# Patient Record
Sex: Female | Born: 1995 | Race: Black or African American | Hispanic: No | Marital: Single | Smoking: Never smoker
Health system: Southern US, Community
[De-identification: ages and names within clinical notes are randomized; demographics above are authoritative.]

## PROBLEM LIST (undated history)

## (undated) ENCOUNTER — Inpatient Hospital Stay (HOSPITAL_COMMUNITY): Payer: Self-pay

## (undated) DIAGNOSIS — O24419 Gestational diabetes mellitus in pregnancy, unspecified control: Secondary | ICD-10-CM

## (undated) DIAGNOSIS — J3089 Other allergic rhinitis: Secondary | ICD-10-CM

## (undated) DIAGNOSIS — J45909 Unspecified asthma, uncomplicated: Secondary | ICD-10-CM

## (undated) HISTORY — PX: NO PAST SURGERIES: SHX2092

---
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
## 2018-01-31 ENCOUNTER — Encounter (HOSPITAL_COMMUNITY): Payer: Self-pay | Admitting: *Deleted

## 2018-01-31 ENCOUNTER — Inpatient Hospital Stay (HOSPITAL_COMMUNITY)
Admission: AD | Admit: 2018-01-31 | Discharge: 2018-01-31 | Disposition: A | Discharge status: Home / Self Care | Payer: Managed Care, Other (non HMO) | Source: Ambulatory Visit | Attending: Obstetrics & Gynecology | Admitting: Obstetrics & Gynecology

## 2018-01-31 DIAGNOSIS — O4690 Antepartum hemorrhage, unspecified, unspecified trimester: Secondary | ICD-10-CM

## 2018-01-31 DIAGNOSIS — N921 Excessive and frequent menstruation with irregular cycle: Secondary | ICD-10-CM

## 2018-01-31 DIAGNOSIS — Z79899 Other long term (current) drug therapy: Secondary | ICD-10-CM | POA: Diagnosis not present

## 2018-01-31 DIAGNOSIS — N939 Abnormal uterine and vaginal bleeding, unspecified: Secondary | ICD-10-CM | POA: Insufficient documentation

## 2018-01-31 DIAGNOSIS — B379 Candidiasis, unspecified: Secondary | ICD-10-CM | POA: Diagnosis not present

## 2018-01-31 DIAGNOSIS — J45909 Unspecified asthma, uncomplicated: Secondary | ICD-10-CM | POA: Insufficient documentation

## 2018-01-31 DIAGNOSIS — R109 Unspecified abdominal pain: Secondary | ICD-10-CM | POA: Insufficient documentation

## 2018-01-31 HISTORY — DX: Unspecified asthma, uncomplicated: J45.909

## 2018-01-31 HISTORY — DX: Other allergic rhinitis: J30.89

## 2018-01-31 LAB — URINALYSIS, ROUTINE W REFLEX MICROSCOPIC
Bacteria, UA: NONE SEEN
Bilirubin Urine: NEGATIVE
GLUCOSE, UA: NEGATIVE mg/dL
Ketones, ur: NEGATIVE mg/dL
Leukocytes, UA: NEGATIVE
NITRITE: NEGATIVE
Protein, ur: NEGATIVE mg/dL
SPECIFIC GRAVITY, URINE: 1.02 (ref 1.005–1.030)
pH: 7 (ref 5.0–8.0)

## 2018-01-31 LAB — HCG, QUANTITATIVE, PREGNANCY: hCG, Beta Chain, Quant, S: <1 m[IU]/mL (ref <5)

## 2018-01-31 LAB — POCT PREGNANCY, URINE: Preg Test, Ur: NEGATIVE

## 2018-01-31 MED ORDER — FLUCONAZOLE 150 MG PO TABS: 150.0000 mg | ORAL_TABLET | Freq: Every day | ORAL | 1 refills | Status: DC

## 2018-01-31 NOTE — MAU Note (Signed)
Pt C/O bleeding that started today, lower abd cramping for the last 2-3 weeks.  Pos UPT @ MD office two days ago.

## 2018-01-31 NOTE — Progress Notes (Signed)
Patient signed printed copy of AVS.  Discharge instructions and Rx reviewed.  Pt verbalized understanding.  No questions at this time.  Dc'd home in good condition.

## 2018-01-31 NOTE — MAU Provider Note (Signed)
Chief Complaint: Abdominal Pain and Vaginal Bleeding   SUBJECTIVE HPI: Alexandra Ayala is a 22 y.o. No obstetric history on file. at Unknown who presents to MAU vaginal bleeding that started x 1 day ago, pt endorses has had small dime sized clotts, no fould order, mild cramping, has nto taken any medication for it, pt was seen in [facility] 2 days ago and was informed her UPT was positive, she endorses she was on BCP and at time has missed one pill but took 2 the next day. Pt stated she was having mild cramps that began as soon as her LMP (aug 5 ) ended and was unsure of what it was, that prompted [facility] to perform a vaginal probe and pt was told her had yeast infecting, pt was given cream but never picked it up, pt denies n, v, d, rashes, fever, cp or sob, no increase in vaginal discharge at this time. Pt denies dysuria and denies any vaginal irritation or itching. [facility] did a hcg quant which was neg. Pt was not informed. Pt is in an actively sexual relationship with a female partner and does not wear condoms. Pt also had g/c testing at the office which was negative.   Past Medical History:  Diagnosis Date  . Asthma    has inhaler  . Environmental and seasonal allergies    OB History  Gravida Para Term Preterm AB Living  0 0 0 0 0 0  SAB TAB Ectopic Multiple Live Births  0 0 0 0 0   History reviewed. No pertinent surgical history. Social History   Socioeconomic History  . Marital status: Single    Spouse name: Not on file  . Number of children: Not on file  . Years of education: Not on file  . Highest education level: Not on file  Occupational History  . Not on file  Social Needs  . Financial resource strain: Not on file  . Food insecurity:    Worry: Not on file    Inability: Not on file  . Transportation needs:    Medical: Not on file    Non-medical: Not on file  Tobacco Use  . Smoking status: Never Smoker  . Smokeless tobacco: Never Used  Substance and Sexual Activity  . Alcohol use:  Yes    Comment: occasional, last on 8-18  . Drug use: Yes    Types: Marijuana    Comment: last used 01-28-18  . Sexual activity: Not on file  Lifestyle  . Physical activity:    Days per week: Not on file    Minutes per session: Not on file  . Stress: Not on file  Relationships  . Social connections:    Talks on phone: Not on file    Gets together: Not on file    Attends religious service: Not on file    Active member of club or organization: Not on file    Attends meetings of clubs or organizations: Not on file    Relationship status: Not on file  . Intimate partner violence:    Fear of current or ex partner: Not on file    Emotionally abused: Not on file    Physically abused: Not on file    Forced sexual activity: Not on file  Other Topics Concern  . Not on file  Social History Narrative  . Not on file   No current facility-administered medications on file prior to encounter.    Current Outpatient Medications on File Prior to  Encounter  Medication Sig Dispense Refill  . albuterol (PROVENTIL HFA;VENTOLIN HFA) 108 (90 Base) MCG/ACT inhaler Inhale 2 puffs into the lungs every 6 (six) hours as needed for wheezing or shortness of breath.    . cetirizine (ZYRTEC) 10 MG tablet Take 10 mg by mouth daily.     No Known Allergies  I have reviewed the past Medical Hx, Surgical Hx, Social Hx, Allergies and Medications.   REVIEW OF SYSTEMS All systems reviewed and are negative for acute change except as noted in the HPI.   OBJECTIVE BP 134/87 (BP Location: Right Arm)   Pulse 76   Temp 98.3 F (36.8 C) (Oral)   Resp 16   Ht 5\' 4"  (1.626 m)   Wt 70.3 kg   LMP 01/13/2018   BMI 26.61 kg/m    PHYSICAL EXAM Constitutional: Well-developed, well-nourished female in no acute distress.  Cardiovascular: normal rate and rhythm, pulses intact Respiratory: normal rate and effort.  GI: Abd soft, non-tender, non-distended. Pos BS x 4 MS: Extremities nontender, no edema, normal  ROM Neurologic: Alert and oriented x 4. No focal deficits GU: Neg CVAT. SPECULUM EXAM: NEFG, physiologic discharge, blood noted from cervical opening  BIMANUAL: cervix closed thick and high no CMT; uterus normal size, no adnexal tenderness or masses. No CMT. Psych: normal mood and affect  LAB RESULTS Results for orders placed or performed during the hospital encounter of 01/31/18 (from the past 24 hour(s))  Urinalysis, Routine w reflex microscopic     Status: Abnormal   Collection Time: 01/31/18 12:38 PM  Result Value Ref Range   Color, Urine YELLOW YELLOW   APPearance CLEAR CLEAR   Specific Gravity, Urine 1.020 1.005 - 1.030   pH 7.0 5.0 - 8.0   Glucose, UA NEGATIVE NEGATIVE mg/dL   Hgb urine dipstick LARGE (A) NEGATIVE   Bilirubin Urine NEGATIVE NEGATIVE   Ketones, ur NEGATIVE NEGATIVE mg/dL   Protein, ur NEGATIVE NEGATIVE mg/dL   Nitrite NEGATIVE NEGATIVE   Leukocytes, UA NEGATIVE NEGATIVE   RBC / HPF 0-5 0 - 5 RBC/hpf   WBC, UA 0-5 0 - 5 WBC/hpf   Bacteria, UA NONE SEEN NONE SEEN   Squamous Epithelial / LPF 0-5 0 - 5   Mucus PRESENT   Pregnancy, urine POC     Status: None   Collection Time: 01/31/18 12:40 PM  Result Value Ref Range   Preg Test, Ur NEGATIVE NEGATIVE  hCG, quantitative, pregnancy     Status: None   Collection Time: 01/31/18  1:50 PM  Result Value Ref Range   hCG, Beta Chain, Quant, S <1 <5 mIU/mL    IMAGING No results found.  MAU Management/MDM: Vitals and nursing notes reviewed Orders Placed This Encounter  Procedures  . [place] MFM OB Comp Less 14 Wks  . Urinalysis, Routine w reflex microscopic  . hCG, quantitative, pregnancy  . Diet - low sodium heart healthy  . Increase activity slowly  . Call MD for:  . Call MD for:  temperature >100.4  . Call MD for:  persistant nausea and vomiting  . Call MD for:  severe uncontrolled pain  . Call MD for:  redness, tenderness, or signs of infection (pain, swelling, redness, odor or green/yellow discharge  around incision site)  . Call MD for:  difficulty breathing, headache or visual disturbances  . Call MD for:  hives  . Call MD for:  persistant dizziness or light-headedness  . Call MD for:  extreme fatigue  . (HEART FAILURE  PATIENTS) Call MD:  Anytime you have any of the following symptoms: 1) 3 pound weight gain in 24 hours or 5 pounds in 1 week 2) shortness of breath, with or without a dry hacking cough 3) swelling in the hands, feet or stomach 4) if you have to sleep on extra pillows at night in order to breathe.  . Pregnancy, urine POC  . Discharge patient Discharge disposition: 01-Home or Self Care; Discharge patient date: 01/31/2018    Meds ordered this encounter  Medications  . fluconazole (DIFLUCAN) 150 MG tablet    Sig: Take 1 tablet (150 mg total) by mouth daily.    Dispense:  1 tablet    Refill:  1    Order Specific Question:   Supervising Provider    Answer:   Osborn CohoOBERTS, ANGELA [[id number]]    Plan of care reviewed with patient, including labs and tests ordered and medical treatment.   ASSESSMENT 1. Breakthrough bleeding   2. Vaginal bleeding in pregnancy   3. Candidiasis   Pt not preg, neg UPT and beta quant <1. Vaginal exam unremarkable, blood present in cervical OS, my impression is this bleeding is due to stopping her BCP 2 days ago. Pt denies vaginal itching but since she was dx with a yeast infection from the office pt did not pick up her cream and wanted by mouth pills, therefore script given.  PLAN Discharge home in stable condition. Pt discharged with strict bleeding precautions. Counseled on return precautions Start taking BCP again with a Sunday start date and a new pack use back up condoms for 1 week.  Yeast: Take one pill by mouth now and may take another pill in three days if symptoms do not resolve.  Handout given  Follow-up Information    [facility] up in 2 week(s).   Specialty:  Obstetrics and Gynecology Contact  information: [address] [place] [phone]          Allergies as of 01/31/2018   No Known Allergies     Medication List    TAKE these medications   albuterol 108 (90 Base) MCG/ACT inhaler Commonly known as:  PROVENTIL HFA;VENTOLIN HFA Inhale 2 puffs into the lungs every 6 (six) hours as needed for wheezing or shortness of breath.   cetirizine 10 MG tablet Commonly known as:  ZYRTEC Take 10 mg by mouth daily.   fluconazole 150 MG tablet Commonly known as:  DIFLUCAN Take 1 tablet (150 mg total) by mouth daily.      Jaleena Viviani, NP-C, CNM 01/31/2018, 4:19 PM

## 2018-02-27 ENCOUNTER — Encounter: Payer: Self-pay | Admitting: Family Medicine

## 2018-02-27 ENCOUNTER — Ambulatory Visit: Payer: Managed Care, Other (non HMO) | Admitting: Family Medicine

## 2018-02-27 ENCOUNTER — Ambulatory Visit (INDEPENDENT_AMBULATORY_CARE_PROVIDER_SITE_OTHER): Payer: Managed Care, Other (non HMO) | Admitting: Family Medicine

## 2018-02-27 VITALS — BP 126/82 | HR 84 | Temp 98.5°F | Ht 64.0 in | Wt 155.0 lb

## 2018-02-27 DIAGNOSIS — Z0000 Encounter for general adult medical examination without abnormal findings: Secondary | ICD-10-CM | POA: Diagnosis not present

## 2018-02-27 DIAGNOSIS — J302 Other seasonal allergic rhinitis: Secondary | ICD-10-CM | POA: Insufficient documentation

## 2018-02-27 DIAGNOSIS — R635 Abnormal weight gain: Secondary | ICD-10-CM | POA: Diagnosis not present

## 2018-02-27 DIAGNOSIS — Z131 Encounter for screening for diabetes mellitus: Secondary | ICD-10-CM

## 2018-02-27 LAB — CBC WITH DIFFERENTIAL/PLATELET
BASOS ABS: 0 10*3/uL (ref 0.0–0.1)
Basophils Relative: 0.5 % (ref 0.0–3.0)
EOS PCT: 2.9 % (ref 0.0–5.0)
Eosinophils Absolute: 0.1 10*3/uL (ref 0.0–0.7)
HEMATOCRIT: 41.5 % (ref 36.0–46.0)
Hemoglobin: 13.9 g/dL (ref 12.0–15.0)
Lymphocytes Relative: 53.3 % — ABNORMAL HIGH (ref 12.0–46.0)
Lymphs Abs: 2.6 10*3/uL (ref 0.7–4.0)
MCHC: 33.4 g/dL (ref 30.0–36.0)
MCV: 90.7 fl (ref 78.0–100.0)
MONOS PCT: 8.1 % (ref 3.0–12.0)
Monocytes Absolute: 0.4 10*3/uL (ref 0.1–1.0)
Neutro Abs: 1.7 10*3/uL (ref 1.4–7.7)
Neutrophils Relative %: 35.2 % — ABNORMAL LOW (ref 43.0–77.0)
PLATELETS: 296 10*3/uL (ref 150.0–400.0)
RBC: 4.58 Mil/uL (ref 3.87–5.11)
RDW: 12.7 % (ref 11.5–15.5)
WBC: 4.8 10*3/uL (ref 4.0–10.5)

## 2018-02-27 LAB — TSH: TSH: 0.4 u[IU]/mL (ref 0.35–4.50)

## 2018-02-27 LAB — BASIC METABOLIC PANEL
BUN: 11 mg/dL (ref 6–23)
CHLORIDE: 103 meq/L (ref 96–112)
CO2: 27 mEq/L (ref 19–32)
Calcium: 9.8 mg/dL (ref 8.4–10.5)
Creatinine, Ser: 0.73 mg/dL (ref 0.40–1.20)
GFR: 128.9 mL/min (ref >60.00)
Glucose, Bld: 80 mg/dL (ref 70–99)
POTASSIUM: 4.2 meq/L (ref 3.5–5.1)
Sodium: 139 mEq/L (ref 135–145)

## 2018-02-27 LAB — T4, FREE: Free T4: 0.93 ng/dL (ref 0.60–1.60)

## 2018-02-27 LAB — HEMOGLOBIN A1C: HEMOGLOBIN A1C: 4.9 % (ref 4.6–6.5)

## 2018-02-27 NOTE — Progress Notes (Signed)
Subjective:     Alexandra Ayala is a 22 y.o. female and is here for a comprehensive physical exam.  The patient reports problems - weight gain.  Pt notes weight gain over the last few months without trying.  Pt states she is active at her job at [employer].  Pt also notes increased thirst over the last month.  Pt may drink 2-3 bottles of water per day.    Pt was recently seen by OB/Gyn. Pt was told she was pregnant, but later told it was a false positive test.  Seasonal allergies:  -will take zyrtec  Allergies: NKDA  Pt working at [employer].  Pt denies EtOH, tobacco, and drug use.  Social History   Socioeconomic History  . Marital status: Single    Spouse name: Not on file  . Number of children: Not on file  . Years of education: Not on file  . Highest education level: Not on file  Occupational History  . Not on file  Social Needs  . Financial resource strain: Not on file  . Food insecurity:    Worry: Not on file    Inability: Not on file  . Transportation needs:    Medical: Not on file    Non-medical: Not on file  Tobacco Use  . Smoking status: Never Smoker  . Smokeless tobacco: Never Used  Substance and Sexual Activity  . Alcohol use: Yes    Comment: occasional, last on 8-18  . Drug use: Yes    Types: Marijuana    Comment: last used 01-28-18  . Sexual activity: Not on file  Lifestyle  . Physical activity:    Days per week: Not on file    Minutes per session: Not on file  . Stress: Not on file  Relationships  . Social connections:    Talks on phone: Not on file    Gets together: Not on file    Attends religious service: Not on file    Active member of club or organization: Not on file    Attends meetings of clubs or organizations: Not on file    Relationship status: Not on file  . Intimate partner violence:    Fear of current or ex partner: Not on file    Emotionally abused: Not on file    Physically abused: Not on file    Forced sexual activity:  Not on file  Other Topics Concern  . Not on file  Social History Narrative  . Not on file   Health Maintenance  Topic Date Due  . HIV Screening  09/26/2011  . TETANUS/TDAP  09/26/2015  . PAP SMEAR  09/25/2017  . INFLUENZA VACCINE  01/09/2018    The following portions of the patient's history were reviewed and updated as appropriate: allergies, current medications, past family history, past medical history, past social history, past surgical history and problem list.  Review of Systems A comprehensive review of systems was negative.   Objective:    BP 126/82 (BP Location: Right Arm, Patient Position: Sitting, Cuff Size: Normal)   Pulse 84   Temp 98.5 F (36.9 C) (Oral)   Ht 5\' 4"  (1.626 m)   Wt 155 lb (70.3 kg)   LMP 02/26/2018 (Exact Date)   SpO2 98%   BMI 26.61 kg/m  General appearance: alert, cooperative, appears stated age and no distress Head: Normocephalic, without obvious abnormality, atraumatic Eyes: conjunctivae/corneas clear. PERRL, EOM's intact. Fundi benign. Ears: normal TM's and external ear canals  both ears Nose: Nares normal. Septum midline. Mucosa normal. No drainage or sinus tenderness. Throat: lips, mucosa, and tongue normal; teeth and gums normal Neck: no adenopathy, no JVD, supple, symmetrical, trachea midline and thyroid not enlarged, symmetric, no tenderness/mass/nodules Lungs: clear to auscultation bilaterally Heart: regular rate and rhythm, S1, S2 normal, no murmur, click, rub or gallop Abdomen: soft, non-tender; bowel sounds normal; no masses,  no organomegaly Extremities: extremities normal, atraumatic, no cyanosis or edema Skin: Skin color, texture, turgor normal. No rashes or lesions Neurologic: Alert and oriented X 3, normal strength and tone. Normal symmetric reflexes. Normal coordination and gait    Assessment:    Healthy female exam.      Plan:     Anticipatory guidance given including wearing seatbelts, smoke detectors in the home,  increasing physical activity, increasing p.o. intake of water and vegetables. -will obtain labs  -pap up to date, followed by OB/Gyn -given handout -next CPE in 1 yr See After Visit Summary for Counseling Recommendations   Weight gain -will obtain TSH, free T4  Seasonal allergies -continue Zyrtec prn  F/u prn  Abbe Amsterdam, MD

## 2018-02-27 NOTE — Patient Instructions (Addendum)
Preventive Care 18-39 Years, Female Preventive care refers to lifestyle choices and visits with your health care provider that can promote health and wellness. What does preventive care include?  A yearly physical exam. This is also called an annual well check.  Dental exams once or twice a year.  Routine eye exams. Ask your health care provider how often you should have your eyes checked.  Personal lifestyle choices, including: ? Daily care of your teeth and gums. ? Regular physical activity. ? Eating a healthy diet. ? Avoiding tobacco and drug use. ? Limiting alcohol use. ? Practicing safe sex. ? Taking vitamin and mineral supplements as recommended by your health care provider. What happens during an annual well check? The services and screenings done by your health care provider during your annual well check will depend on your age, overall health, lifestyle risk factors, and family history of disease. Counseling Your health care provider may ask you questions about your:  Alcohol use.  Tobacco use.  Drug use.  Emotional well-being.  Home and relationship well-being.  Sexual activity.  Eating habits.  Work and work Statistician.  Method of birth control.  Menstrual cycle.  Pregnancy history.  Screening You may have the following tests or measurements:  Height, weight, and BMI.  Diabetes screening. This is done by checking your blood sugar (glucose) after you have not eaten for a while (fasting).  Blood pressure.  Lipid and cholesterol levels. These may be checked every 5 years starting at age 66.  Skin check.  Hepatitis C blood test.  Hepatitis B blood test.  Sexually transmitted disease (STD) testing.  BRCA-related cancer screening. This may be done if you have a family history of breast, ovarian, tubal, or peritoneal cancers.  Pelvic exam and Pap test. This may be done every 3 years starting at age 40. Starting at age 59, this may be done every 5  years if you have a Pap test in combination with an HPV test.  Discuss your test results, treatment options, and if necessary, the need for more tests with your health care provider. Vaccines Your health care provider may recommend certain vaccines, such as:  Influenza vaccine. This is recommended every year.  Tetanus, diphtheria, and acellular pertussis (Tdap, Td) vaccine. You may need a Td booster every 10 years.  Varicella vaccine. You may need this if you have not been vaccinated.  HPV vaccine. If you are 69 or younger, you may need three doses over 6 months.  Measles, mumps, and rubella (MMR) vaccine. You may need at least one dose of MMR. You may also need a second dose.  Pneumococcal 13-valent conjugate (PCV13) vaccine. You may need this if you have certain conditions and were not previously vaccinated.  Pneumococcal polysaccharide (PPSV23) vaccine. You may need one or two doses if you smoke cigarettes or if you have certain conditions.  Meningococcal vaccine. One dose is recommended if you are age 27-21 years and a first-year college student living in a residence hall, or if you have one of several medical conditions. You may also need additional booster doses.  Hepatitis A vaccine. You may need this if you have certain conditions or if you travel or work in places where you may be exposed to hepatitis A.  Hepatitis B vaccine. You may need this if you have certain conditions or if you travel or work in places where you may be exposed to hepatitis B.  Haemophilus influenzae type b (Hib) vaccine. You may need this if  you have certain risk factors.  Talk to your health care provider about which screenings and vaccines you need and how often you need them. This information is not intended to replace advice given to you by your health care provider. Make sure you discuss any questions you have with your health care provider. Document Released: 07/24/2001 Document Revised: 02/15/2016  Document Reviewed: 03/29/2015 Elsevier Interactive Patient Education  2018 [place] 18-39 Years, Female Preventive care refers to lifestyle choices and visits with your health care provider that can promote health and wellness. What does preventive care include?  A yearly physical exam. This is also called an annual well check.  Dental exams once or twice a year.  Routine eye exams. Ask your health care provider how often you should have your eyes checked.  Personal lifestyle choices, including: ? Daily care of your teeth and gums. ? Regular physical activity. ? Eating a healthy diet. ? Avoiding tobacco and drug use. ? Limiting alcohol use. ? Practicing safe sex. ? Taking vitamin and mineral supplements as recommended by your health care provider. What happens during an annual well check? The services and screenings done by your health care provider during your annual well check will depend on your age, overall health, lifestyle risk factors, and family history of disease. Counseling Your health care provider may ask you questions about your:  Alcohol use.  Tobacco use.  Drug use.  Emotional well-being.  Home and relationship well-being.  Sexual activity.  Eating habits.  Work and work Statistician.  Method of birth control.  Menstrual cycle.  Pregnancy history.  Screening You may have the following tests or measurements:  Height, weight, and BMI.  Diabetes screening. This is done by checking your blood sugar (glucose) after you have not eaten for a while (fasting).  Blood pressure.  Lipid and cholesterol levels. These may be checked every 5 years starting at age 76.  Skin check.  Hepatitis C blood test.  Hepatitis B blood test.  Sexually transmitted disease (STD) testing.  BRCA-related cancer screening. This may be done if you have a family history of breast, ovarian, tubal, or peritoneal cancers.  Pelvic exam and Pap test.  This may be done every 3 years starting at age 46. Starting at age 55, this may be done every 5 years if you have a Pap test in combination with an HPV test.  Discuss your test results, treatment options, and if necessary, the need for more tests with your health care provider. Vaccines Your health care provider may recommend certain vaccines, such as:  Influenza vaccine. This is recommended every year.  Tetanus, diphtheria, and acellular pertussis (Tdap, Td) vaccine. You may need a Td booster every 10 years.  Varicella vaccine. You may need this if you have not been vaccinated.  HPV vaccine. If you are 2 or younger, you may need three doses over 6 months.  Measles, mumps, and rubella (MMR) vaccine. You may need at least one dose of MMR. You may also need a second dose.  Pneumococcal 13-valent conjugate (PCV13) vaccine. You may need this if you have certain conditions and were not previously vaccinated.  Pneumococcal polysaccharide (PPSV23) vaccine. You may need one or two doses if you smoke cigarettes or if you have certain conditions.  Meningococcal vaccine. One dose is recommended if you are age 53-21 years and a first-year college student living in a residence hall, or if you have one of several medical conditions. You may also need  additional booster doses.  Hepatitis A vaccine. You may need this if you have certain conditions or if you travel or work in places where you may be exposed to hepatitis A.  Hepatitis B vaccine. You may need this if you have certain conditions or if you travel or work in places where you may be exposed to hepatitis B.  Haemophilus influenzae type b (Hib) vaccine. You may need this if you have certain risk factors.  Talk to your health care provider about which screenings and vaccines you need and how often you need them. This information is not intended to replace advice given to you by your health care provider. Make sure you discuss any questions you  have with your health care provider. Document Released: 07/24/2001 Document Revised: 02/15/2016 Document Reviewed: 03/29/2015 Elsevier Interactive Patient Education  2018 [employer] Inc[employer]  

## 2019-05-05 ENCOUNTER — Other Ambulatory Visit: Payer: Self-pay

## 2019-05-05 DIAGNOSIS — K624 Stenosis of anus and rectum: Secondary | ICD-10-CM

## 2019-05-07 LAB — NOVEL CORONAVIRUS, NAA: SARS-CoV-2, NAA: NOT DETECTED

## 2021-12-09 ENCOUNTER — Encounter (HOSPITAL_COMMUNITY): Payer: Self-pay | Admitting: *Deleted

## 2021-12-09 ENCOUNTER — Other Ambulatory Visit: Payer: Self-pay

## 2021-12-09 ENCOUNTER — Emergency Department (HOSPITAL_COMMUNITY)
Admission: EM | Admit: 2021-12-09 | Discharge: 2021-12-09 | Disposition: A | Discharge status: Home / Self Care | Payer: Managed Care, Other (non HMO) | Attending: Emergency Medicine | Admitting: Emergency Medicine

## 2021-12-09 DIAGNOSIS — M25551 Pain in right hip: Secondary | ICD-10-CM | POA: Diagnosis not present

## 2021-12-09 DIAGNOSIS — M25552 Pain in left hip: Secondary | ICD-10-CM | POA: Insufficient documentation

## 2021-12-09 DIAGNOSIS — R1031 Right lower quadrant pain: Secondary | ICD-10-CM

## 2021-12-09 DIAGNOSIS — R1030 Lower abdominal pain, unspecified: Secondary | ICD-10-CM | POA: Diagnosis not present

## 2021-12-09 DIAGNOSIS — R102 Pelvic and perineal pain: Secondary | ICD-10-CM | POA: Diagnosis present

## 2021-12-09 LAB — URINALYSIS, ROUTINE W REFLEX MICROSCOPIC
Bacteria, UA: NONE SEEN
Bilirubin Urine: NEGATIVE
Glucose, UA: NEGATIVE mg/dL
Ketones, ur: NEGATIVE mg/dL
Leukocytes,Ua: NEGATIVE
Nitrite: NEGATIVE
Protein, ur: NEGATIVE mg/dL
Specific Gravity, Urine: 1.026 (ref 1.005–1.030)
pH: 5 (ref 5.0–8.0)

## 2021-12-09 LAB — I-STAT BETA HCG BLOOD, ED (MC, WL, AP ONLY): I-stat hCG, quantitative: <5 m[IU]/mL (ref <5)

## 2021-12-09 MED ORDER — IBUPROFEN 200 MG PO TABS
600.0000 mg | ORAL_TABLET | Freq: Once | ORAL | Status: AC
  Administered 2021-12-09: 600 mg via ORAL
  Filled 2021-12-09: qty 3

## 2021-12-09 NOTE — Discharge Instructions (Addendum)
Please follow-up with your OB/GYN and your primary care provider.  You may always return to the emergency room for any new or concerning symptoms.  I do recommend taking Tylenol and ibuprofen as discussed below  Please use Tylenol or ibuprofen for pain.  You may use 600 mg ibuprofen every 6 hours or 1000 mg of Tylenol every 6 hours.  You may choose to alternate between the 2.  This would be most effective.  Not to exceed 4 g of Tylenol within 24 hours.  Not to exceed 3200 mg ibuprofen 24 hours.

## 2021-12-09 NOTE — ED Notes (Signed)
Pt d/c home per MD order. Discharge summary reviewed with pt, pt verbalizes understanding. Ambulatory off unit. No s/s of acute distress noted at discharge.  °

## 2021-12-09 NOTE — ED Provider Notes (Signed)
[facility]-EMERGENCY DEPT Provider Note   CSN: [id number] Arrival date & time: 12/09/21  1220     History  Chief Complaint  Patient presents with   Pelvic Pain    Alexandra Ayala is a 26 y.o. female.   Pelvic Pain   26 yo female with history of restless leg syndrome presents with bilateral hip pain x1 day. She states the pain started last night and describes it as an aching pain that is worse when she walks. She is not currently in pain. She stopped taking oral birth control one month ago. Her LMP was last week. She denies vaginal discharge, bleeding, pruritis, and pain. Denies urinary symptoms, fever, chills.       Home Medications Prior to Admission medications   Medication Sig Start Date End Date Taking? Authorizing Provider  albuterol (PROVENTIL HFA;VENTOLIN HFA) 108 (90 Base) MCG/ACT inhaler Inhale 2 puffs into the lungs every 6 (six) hours as needed for wheezing or shortness of breath.    [provider]  cetirizine (ZYRTEC) 10 MG tablet Take 10 mg by mouth daily.    [provider]      Allergies    Patient has no known allergies.    Review of Systems   Review of Systems  Genitourinary:  Positive for pelvic pain.    Physical Exam Updated Vital Signs BP 136/75   Pulse 86   Temp 98.4 F (36.9 C) (Oral)   Resp 16   Ht 5\' 4"  (1.626 m)   Wt 78.5 kg   SpO2 96%   BMI 29.70 kg/m  Physical Exam Vitals and nursing note reviewed.  Constitutional:      General: She is not in acute distress. HENT:     Head: Normocephalic and atraumatic.     Nose: Nose normal.  Eyes:     General: No scleral icterus. Cardiovascular:     Rate and Rhythm: Normal rate and regular rhythm.     Pulses: Normal pulses.     Heart sounds: Normal heart sounds.  Pulmonary:     Effort: Pulmonary effort is normal. No respiratory distress.     Breath sounds: No wheezing.  Abdominal:     Palpations: Abdomen is soft.     Tenderness: There is no  abdominal tenderness.  Genitourinary:    Comments: Pelvic exam deferred Musculoskeletal:     Cervical back: Normal range of motion.     Right lower leg: No edema.     Left lower leg: No edema.  Skin:    General: Skin is warm and dry.     Capillary Refill: Capillary refill takes less than 2 seconds.  Neurological:     Mental Status: She is alert. Mental status is at baseline.  Psychiatric:        Mood and Affect: Mood normal.        Behavior: Behavior normal.     ED Results / Procedures / Treatments   Labs (all labs ordered are listed, but only abnormal results are displayed) Labs Reviewed  URINALYSIS, ROUTINE W REFLEX MICROSCOPIC - Abnormal; Notable for the following components:      Result Value   Hgb urine dipstick MODERATE (*)    All other components within normal limits  I-STAT BETA HCG BLOOD, ED ([facility], [facility], [facility] ONLY)    EKG None  Radiology No results found.  Procedures Procedures    Medications Ordered in ED Medications  ibuprofen (ADVIL) tablet 600 mg (600 mg Oral Given 12/09/21  1810)    ED Course/ Medical Decision Making/ A&P                           Medical Decision Making Amount and/or Complexity of Data Reviewed Labs: ordered.  Risk OTC drugs.   26 yo female with history of restless leg syndrome presents with bilateral hip pain x1 day. She states the pain started last night and describes it as an aching pain that is worse when she walks. She is not currently in pain. She stopped taking oral birth control one month ago. Her LMP was last week. She denies vaginal discharge, bleeding, pruritis, and pain. Denies urinary symptoms, fever, chills.    Physical exam without significant abnormalities.  Urinalysis moderate hemoglobin patient states that she just recently finished menses.  I-STAT negative for pregnancy  I offered patient additional work-up including pelvic exam, basic labs.  She declined this.  She will follow-up with her OB/GYN.  Provided with  a dose of ibuprofen here.  Return precautions discussed  Final Clinical Impression(s) / ED Diagnoses Final diagnoses:  Inguinal pain of both sides    Rx / DC Orders ED Discharge Orders     None         Gailen Shelter, [place] 12/09/21 1939    Mancel Bale, MD 12/09/21 [phone]

## 2021-12-09 NOTE — ED Triage Notes (Signed)
States she had pelvic pain last night, denies discharge but has noticed urinary frequency last 2 days.

## 2023-06-12 NOTE — L&D Delivery Note (Signed)
 Delivery Note At 1:51 AM a viable and healthy female was delivered via Vaginal, Spontaneous (Presentation: Left Occiput Anterior).  APGAR: 8, 9; weight pending .   Placenta status: Spontaneous, Intact.  Cord: 3 vessels with loose nuchal cord x 1 reduced on the perineum.  The patient pushed for approximately 72 minutes and delivered a vigorous female infant in the left occiput anterior presentation with Apgar scores of 8 at 1 minute and 9 at 5 minutes.  With crowning of the infant's head the anterior shoulder delivered without difficulty.  Loose nuchal cord was noted and reduced at this time.  The posterior shoulder and remainder of the body was delivered and the infant was passed to the waiting maternal abdomen.  Following a 1 minute delay the cord was clamped and cut.  The placenta delivered spontaneously, intact, with three-vessel cord.  A second-degree perineal laceration was repaired with 3-0 Vicryl and a right labial tear was repaired with 4-0 Vicryl.  All sponge, needle, instrument counts were correct.  Mom and baby are doing well following delivery.  EBL 553 cc  Anesthesia: Epidural, 1% lidocaine  for repair Episiotomy: None Lacerations: 2nd degree;Perineal, right labial Suture Repair: 3.0 vicryl, 4-0 Vicryl Est. Blood Loss (mL):  553 cc  Mom to postpartum.  Baby to Couplet care / Skin to Skin.  Marjorie Gull 12/26/2023, 2:40 AM

## 2023-06-18 LAB — OB RESULTS CONSOLE HIV ANTIBODY (ROUTINE TESTING): HIV: NONREACTIVE

## 2023-06-18 LAB — OB RESULTS CONSOLE GC/CHLAMYDIA
Chlamydia: NEGATIVE
Neisseria Gonorrhea: NEGATIVE

## 2023-06-18 LAB — OB RESULTS CONSOLE RUBELLA ANTIBODY, IGM: Rubella: IMMUNE

## 2023-06-18 LAB — OB RESULTS CONSOLE HEPATITIS B SURFACE ANTIGEN: Hepatitis B Surface Ag: NEGATIVE

## 2023-06-18 LAB — OB RESULTS CONSOLE RPR: RPR: NONREACTIVE

## 2023-06-18 LAB — HEPATITIS C ANTIBODY: HCV Ab: NEGATIVE

## 2023-07-04 ENCOUNTER — Ambulatory Visit: Payer: BC Managed Care – PPO | Admitting: Family Medicine

## 2023-08-14 ENCOUNTER — Encounter (INDEPENDENT_AMBULATORY_CARE_PROVIDER_SITE_OTHER): Payer: Self-pay

## 2023-10-21 ENCOUNTER — Encounter: Attending: Obstetrics and Gynecology | Admitting: Skilled Nursing Facility1

## 2023-10-21 ENCOUNTER — Encounter: Payer: Self-pay | Admitting: Skilled Nursing Facility1

## 2023-10-21 DIAGNOSIS — O24419 Gestational diabetes mellitus in pregnancy, unspecified control: Secondary | ICD-10-CM | POA: Diagnosis present

## 2023-10-21 NOTE — Progress Notes (Signed)
 Patient was seen for Gestational Diabetes on 10/21/2023  Start time 11:17 and End time 12:17   Estimated due date: due July 29th   Clinical: Medications: N/A Medical History:  Labs: OGTT fasting 98, 1 hour 224, 2 hour 181   Dietary and Lifestyle History:  Pt states she is a Foodie. Pt states she lives with her boyfriend. 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   Physical Activity: walking 30+ minutes 4 days a week Stress: pt stated low  Sleep: fair   24 hr Recall:  First Meal 8:30-9am: fruit + yogurt  Snack:  cheese  Second meal 11-12: ground [place] and peppers and zucchini  Snack 2:  tuna sandwich + 2 pickles Third meal 5-6:  ground [place] and peppers and zucchini or steak + broccoli + salad Snack:  cheese and fruit Beverages:  water  NUTRITION INTERVENTION  Nutrition education (E-1) on the following topics:   Initial Follow-up  [x]  []  Definition of Gestational Diabetes [x]  []  Why dietary management is important in controlling blood glucose [x]  []  Effects each nutrient has on blood glucose levels [x]  []  Simple carbohydrates vs complex carbohydrates [x]  []  Fluid intake [x]  []  Creating a balanced meal plan [x]  []  Carbohydrate counting  [x]  []  When to check blood glucose levels [x]  []  Proper blood glucose monitoring techniques [x]  []  Effect of stress and stress reduction techniques  [x]  []  Exercise effect on blood glucose levels, appropriate exercise during pregnancy [x]  []  Importance of limiting caffeine and abstaining from alcohol and smoking [x]  []  Medications used for blood sugar control during pregnancy [x]  []  Hypoglycemia and rule of 15 [x]  []  Postpartum self care     Patient has a meter prior to visit. Patient is  testing pre breakfast and 2 hours after each meal. FBS: 110, 100 Postprandial: 113, 106  Patient instructed to monitor glucose levels: FBS: 60 - <= 95 mg/dL; 2 hour: <= 147 mg/dL  Patient received handouts: Nutrition  Diabetes and Pregnancy Carbohydrate Counting List Blood glucose log Snack ideas for diabetes during pregnancy  Patient will be seen for follow-up as needed.

## 2023-11-26 ENCOUNTER — Inpatient Hospital Stay (HOSPITAL_COMMUNITY)
Admission: AD | Admit: 2023-11-26 | Discharge: 2023-11-26 | Disposition: A | Discharge status: Home / Self Care | Attending: Obstetrics and Gynecology | Admitting: Obstetrics and Gynecology

## 2023-11-26 ENCOUNTER — Encounter (HOSPITAL_COMMUNITY): Payer: Self-pay | Admitting: *Deleted

## 2023-11-26 DIAGNOSIS — O24414 Gestational diabetes mellitus in pregnancy, insulin controlled: Secondary | ICD-10-CM | POA: Insufficient documentation

## 2023-11-26 DIAGNOSIS — O2313 Infections of bladder in pregnancy, third trimester: Secondary | ICD-10-CM | POA: Diagnosis not present

## 2023-11-26 DIAGNOSIS — O368130 Decreased fetal movements, third trimester, not applicable or unspecified: Secondary | ICD-10-CM | POA: Diagnosis present

## 2023-11-26 DIAGNOSIS — Z3A34 34 weeks gestation of pregnancy: Secondary | ICD-10-CM

## 2023-11-26 DIAGNOSIS — O24419 Gestational diabetes mellitus in pregnancy, unspecified control: Secondary | ICD-10-CM

## 2023-11-26 DIAGNOSIS — N3000 Acute cystitis without hematuria: Secondary | ICD-10-CM

## 2023-11-26 HISTORY — DX: Gestational diabetes mellitus in pregnancy, unspecified control: O24.419

## 2023-11-26 LAB — COMPREHENSIVE METABOLIC PANEL WITH GFR
ALT: 16 U/L (ref 0–44)
AST: 15 U/L (ref 15–41)
Albumin: 2.8 g/dL — ABNORMAL LOW (ref 3.5–5.0)
Alkaline Phosphatase: 77 U/L (ref 38–126)
Anion gap: 11 (ref 5–15)
BUN: 8 mg/dL (ref 6–20)
CO2: 20 mmol/L — ABNORMAL LOW (ref 22–32)
Calcium: 10 mg/dL (ref 8.9–10.3)
Chloride: 105 mmol/L (ref 98–111)
Creatinine, Ser: 0.5 mg/dL (ref 0.44–1.00)
GFR, Estimated: >60 mL/min (ref >60)
Glucose, Bld: 124 mg/dL — ABNORMAL HIGH (ref 70–99)
Potassium: 4.2 mmol/L (ref 3.5–5.1)
Sodium: 136 mmol/L (ref 135–145)
Total Bilirubin: 0.6 mg/dL (ref 0.0–1.2)
Total Protein: 6.5 g/dL (ref 6.5–8.1)

## 2023-11-26 LAB — URINALYSIS, ROUTINE W REFLEX MICROSCOPIC
Bilirubin Urine: NEGATIVE
Glucose, UA: 50 mg/dL — AB
Hgb urine dipstick: NEGATIVE
Ketones, ur: NEGATIVE mg/dL
Nitrite: NEGATIVE
Protein, ur: NEGATIVE mg/dL
Specific Gravity, Urine: 1.016 (ref 1.005–1.030)
pH: 6 (ref 5.0–8.0)

## 2023-11-26 LAB — GLUCOSE, CAPILLARY: Glucose-Capillary: 125 mg/dL — ABNORMAL HIGH (ref 70–99)

## 2023-11-26 MED ORDER — INSULIN GLARGINE 100 UNIT/ML SOLOSTAR PEN: 3.0000 [IU] | PEN_INJECTOR | Freq: Every day | SUBCUTANEOUS | 0 refills | Status: DC

## 2023-11-26 MED ORDER — NITROFURANTOIN MONOHYD MACRO 100 MG PO CAPS: 100.0000 mg | ORAL_CAPSULE | Freq: Two times a day (BID) | ORAL | 0 refills | Status: DC

## 2023-11-26 NOTE — MAU Provider Note (Cosign Needed)
 DFM    S [name] is a 28 y.o. G1P0000 pregnant female at [redacted]w[redacted]d who presents to MAU today with complaint of DFM. Pt states started last nigh and having mild cramping as well.  Denies VB or LOF.  Pt states she started insulin 4days ago for A2GDM.  FHT confirmed in triage.    Receives care at [facility]. Prenatal records reviewed. Started on Humalog 8U after dinner.    Pertinent items noted in HPI and remainder of comprehensive ROS otherwise negative.   O BP 134/76   Pulse (!) 116   Temp 98.6 F (37 C)   Resp 18   Ht 5' 4 (1.626 m)   Wt 98.9 kg   BMI 37.42 kg/m  Physical Exam Constitutional:      General: She is not in acute distress.    Appearance: She is well-developed. She is not ill-appearing.  HENT:     Head: Normocephalic and atraumatic.     Mouth/Throat:     Mouth: Mucous membranes are moist.   Eyes:     Extraocular Movements: Extraocular movements intact.    Cardiovascular:     Rate and Rhythm: Normal rate.  Pulmonary:     Effort: Pulmonary effort is normal. No respiratory distress.  Abdominal:     General: There is no distension.     Palpations: Abdomen is soft.     Tenderness: There is no abdominal tenderness.     Comments: gravid   Skin:    General: Skin is warm and dry.   Neurological:     Mental Status: She is alert and oriented to person, place, and time.     Motor: No weakness.   Psychiatric:        Mood and Affect: Mood normal.        Behavior: Behavior normal.    NST: 135bpm, moderate variability, +accels, no decels, no ctx, adequate fetal movement noted per patient directed   MDM: MAU Course: Pt here for DFM in s/o starting new insuling 4 days ago for newly dx'ed A2GDM.  Pt also with associated pelvic cramping, toco w/o ctx.   CBG 125 CMP glucose 124, otherwise reassuring UA small LE, few bacteria, Gluc and cloudy  Given symptomatic cramping will treat for acute cystitis. Spoke with covering provider for [facility] and  confirmed they are okay with change insulin to long acting lantus to 3U QHS. Pt agreeable, stable for d/c.     AP #[redacted] weeks gestation #A2GDM #Acute cystitis  - sending with Lantus 3U at bedtime - sending with Macrobid for presumed UTI  Discharge from MAU in stable condition with strict/usual precautions Follow up at [facility] as scheduled for ongoing prenatal care  Allergies as of 11/26/2023   No Known Allergies      Medication List     STOP taking these medications    insulin lispro 100 UNIT/ML KwikPen Commonly known as: HUMALOG       TAKE these medications    acyclovir 800 MG tablet Commonly known as: ZOVIRAX Take 800 mg by mouth 2 (two) times daily.   albuterol 108 (90 Base) MCG/ACT inhaler Commonly known as: VENTOLIN HFA Inhale 2 puffs into the lungs every 6 (six) hours as needed for wheezing or shortness of breath.   aspirin EC 81 MG tablet Take 81 mg by mouth daily. Swallow whole.   cetirizine 10 MG tablet Commonly known as: ZYRTEC Take 10 mg by mouth daily.   insulin glargine 100 UNIT/ML Solostar Pen  Commonly known as: LANTUS Inject 3 Units into the skin at bedtime.   multivitamin-prenatal 27-0.8 MG Tabs tablet Take 1 tablet by mouth daily at 12 noon.   nitrofurantoin (macrocrystal-monohydrate) 100 MG capsule Commonly known as: MACROBID Take 1 capsule (100 mg total) by mouth 2 (two) times daily.        Ebony Goldstein, MD 11/26/2023 4:13 PM

## 2023-11-26 NOTE — MAU Note (Signed)
 Alexandra Ayala is a 28 y.o. at [redacted]w[redacted]d here in MAU reporting: Decreased fetal movement since  last nihgt and started having mild cramping as well.denies any vag bleeding or leaking at this time. Started insulin 4 days ago and state her number are fine except for her morning fasting.   LMP:  Onset of complaint: last night Pain score: 4 Vitals:   11/26/23 1334  BP: 134/76  Pulse: (!) 116  Resp: 18  Temp: 98.6 F (37 C)     FHT: 142  Lab orders placed from triage: u/a

## 2023-12-04 ENCOUNTER — Other Ambulatory Visit: Payer: Self-pay

## 2023-12-04 ENCOUNTER — Encounter (HOSPITAL_COMMUNITY): Payer: Self-pay | Admitting: Obstetrics and Gynecology

## 2023-12-04 ENCOUNTER — Other Ambulatory Visit: Payer: Self-pay | Admitting: Obstetrics and Gynecology

## 2023-12-04 ENCOUNTER — Observation Stay (HOSPITAL_COMMUNITY)
Admission: RE | Admit: 2023-12-04 | Discharge: 2023-12-07 | Disposition: A | Discharge status: Home / Self Care | Payer: Self-pay | Attending: Obstetrics and Gynecology | Admitting: Obstetrics and Gynecology

## 2023-12-04 DIAGNOSIS — J45909 Unspecified asthma, uncomplicated: Secondary | ICD-10-CM | POA: Insufficient documentation

## 2023-12-04 DIAGNOSIS — O99519 Diseases of the respiratory system complicating pregnancy, unspecified trimester: Secondary | ICD-10-CM | POA: Insufficient documentation

## 2023-12-04 DIAGNOSIS — Z7901 Long term (current) use of anticoagulants: Secondary | ICD-10-CM | POA: Diagnosis not present

## 2023-12-04 DIAGNOSIS — O24414 Gestational diabetes mellitus in pregnancy, insulin controlled: Secondary | ICD-10-CM

## 2023-12-04 DIAGNOSIS — O24424 Gestational diabetes mellitus in childbirth, insulin controlled: Secondary | ICD-10-CM

## 2023-12-04 LAB — HEMOGLOBIN A1C
Hgb A1c MFr Bld: 5 % (ref 4.8–5.6)
Mean Plasma Glucose: 96.8 mg/dL

## 2023-12-04 LAB — PROTEIN / CREATININE RATIO, URINE
Creatinine, Urine: 32 mg/dL
Total Protein, Urine: <6 mg/dL

## 2023-12-04 LAB — COMPREHENSIVE METABOLIC PANEL WITH GFR
ALT: 13 U/L (ref 0–44)
AST: 17 U/L (ref 15–41)
Albumin: 2.8 g/dL — ABNORMAL LOW (ref 3.5–5.0)
Alkaline Phosphatase: 81 U/L (ref 38–126)
Anion gap: 10 (ref 5–15)
BUN: 9 mg/dL (ref 6–20)
CO2: 20 mmol/L — ABNORMAL LOW (ref 22–32)
Calcium: 9.3 mg/dL (ref 8.9–10.3)
Chloride: 104 mmol/L (ref 98–111)
Creatinine, Ser: 0.67 mg/dL (ref 0.44–1.00)
GFR, Estimated: >60 mL/min (ref >60)
Glucose, Bld: 106 mg/dL — ABNORMAL HIGH (ref 70–99)
Potassium: 3.9 mmol/L (ref 3.5–5.1)
Sodium: 134 mmol/L — ABNORMAL LOW (ref 135–145)
Total Bilirubin: 0.5 mg/dL (ref 0.0–1.2)
Total Protein: 6.5 g/dL (ref 6.5–8.1)

## 2023-12-04 LAB — CBC
HCT: 38.6 % (ref 36.0–46.0)
Hemoglobin: 12.6 g/dL (ref 12.0–15.0)
MCH: 29.6 pg (ref 26.0–34.0)
MCHC: 32.6 g/dL (ref 30.0–36.0)
MCV: 90.8 fL (ref 80.0–100.0)
Platelets: 274 10*3/uL (ref 150–400)
RBC: 4.25 MIL/uL (ref 3.87–5.11)
RDW: 13.7 % (ref 11.5–15.5)
WBC: 7.7 10*3/uL (ref 4.0–10.5)
nRBC: 0 % (ref 0.0–0.2)

## 2023-12-04 LAB — GLUCOSE, CAPILLARY: Glucose-Capillary: 123 mg/dL — ABNORMAL HIGH (ref 70–99)

## 2023-12-04 MED ORDER — DOCUSATE SODIUM 100 MG PO CAPS
100.0000 mg | ORAL_CAPSULE | Freq: Every day | ORAL | Status: DC
  Administered 2023-12-05 – 2023-12-07 (×3): 100 mg via ORAL
  Filled 2023-12-04 (×4): qty 1

## 2023-12-04 MED ORDER — ACETAMINOPHEN 325 MG PO TABS: 650.0000 mg | ORAL_TABLET | ORAL | Status: DC | PRN

## 2023-12-04 MED ORDER — ZOLPIDEM TARTRATE 5 MG PO TABS: 5.0000 mg | ORAL_TABLET | Freq: Every evening | ORAL | Status: DC | PRN

## 2023-12-04 MED ORDER — LACTATED RINGERS IV SOLN: 125.0000 mL/h | INTRAVENOUS | Status: AC

## 2023-12-04 MED ORDER — CALCIUM CARBONATE ANTACID 500 MG PO CHEW
2.0000 | CHEWABLE_TABLET | ORAL | Status: DC | PRN
  Administered 2023-12-04 – 2023-12-07 (×5): 400 mg via ORAL
  Filled 2023-12-04 (×5): qty 2

## 2023-12-04 MED ORDER — INSULIN ASPART 100 UNIT/ML IJ SOLN
0.0000 [IU] | Freq: Three times a day (TID) | INTRAMUSCULAR | Status: DC
  Administered 2023-12-04 – 2023-12-05 (×3): 3 [IU] via SUBCUTANEOUS
  Administered 2023-12-05: 2 [IU] via SUBCUTANEOUS
  Administered 2023-12-06 (×2): 3 [IU] via SUBCUTANEOUS
  Administered 2023-12-06: 2 [IU] via SUBCUTANEOUS

## 2023-12-04 MED ORDER — PRENATAL MULTIVITAMIN CH
1.0000 | ORAL_TABLET | Freq: Every day | ORAL | Status: DC
  Administered 2023-12-05 – 2023-12-06 (×2): 1 via ORAL
  Filled 2023-12-04 (×2): qty 1

## 2023-12-04 MED ORDER — INSULIN NPH (HUMAN) (ISOPHANE) 100 UNIT/ML ~~LOC~~ SUSP
10.0000 [IU] | Freq: Two times a day (BID) | SUBCUTANEOUS | Status: DC
  Administered 2023-12-04 – 2023-12-05 (×2): 10 [IU] via SUBCUTANEOUS
  Filled 2023-12-04: qty 10

## 2023-12-04 NOTE — H&P (Signed)
 Alexandra Ayala is a 28 y.o. female presenting for glucoregulation  28 year old G1 P0 at 35+1 presents for antenatal admission for glucose regelation. The patient's pregnancy has been complicated by gestational diabetes. The decision to add insulin for glucose control was made at 31 weeks. Initially, the patient's insurance would not cover Levemir therefore NPH insulin was sent. during the next three visits, the patient still had not obtained her insulin. The patient was seen in MAU last week for evaluation of decreased fetal movement and was somehow started on Semglee 3U HS by the MAU physician. Patient has taken this for the last week, however she reports her fasting blood sugars are still elevated. During her office visit today, she had not brought her glucose log for evaluation. Given the confusion regarding the recommended insulin regimen and the inability to review accurate glucose logs the decision was made to admit the patient to [facility] for glucose regulation today.  Pregnancy Problems: 1) A2DM: likely poor control 2) BMI 32 3) Asthma: albuterol prn 4) Genital HSV  OB History     Gravida  1   Para  0   Term  0   Preterm  0   AB  0   Living  0      SAB  0   IAB  0   Ectopic  0   Multiple  0   Live Births  0          Past Medical History:  Diagnosis Date   Asthma    has inhaler   Environmental and seasonal allergies    Gestational diabetes    Past Surgical History:  Procedure Laterality Date   NO PAST SURGERIES     Family History: family history is not on file. Social History:  reports that she has never smoked. She has never used smokeless tobacco. She reports that she does not currently use alcohol. She reports that she does not currently use drugs after having used the following drugs: Marijuana.     Maternal Diabetes: Yes:  Diabetes Type:  Insulin/Medication controlled Genetic Screening: Normal Maternal Ultrasounds/Referrals: Normal Fetal  Ultrasounds or other Referrals:  None Maternal Substance Abuse:  No Significant Maternal Medications:  None Significant Maternal Lab Results:  None Number of Prenatal Visits:greater than 3 verified prenatal visits Maternal Vaccinations: Other Comments:  None  Review of Systems History   Blood pressure 123/66, pulse 95, temperature 97.9 F (36.6 C), temperature source Oral, resp. rate 19, SpO2 100%. Exam Physical Exam  AOX3, NAD Abd gravid, soft NST in office today reactive, cat 1 tracing  Prenatal labs: ABO, Rh: --/--/O POS (06/25 1858) Antibody: NEG (06/25 1858) Rubella:  Imm RPR:   NR HBsAg:   Neg HIV:   NR GBS:   not yet completed Results for orders placed or performed during the hospital encounter of 12/04/23 (from the past 24 hours)  Type and screen     Status: None   Collection Time: 12/04/23  6:58 PM  Result Value Ref Range   ABO/RH(D) O POS    Antibody Screen NEG    Sample Expiration      12/07/2023,2359 Performed at [facility], [address], [place], [place] [address]   CBC     Status: None   Collection Time: 12/04/23  6:58 PM  Result Value Ref Range   WBC 7.7 4.0 - 10.5 K/uL   RBC 4.25 3.87 - 5.11 MIL/uL   Hemoglobin 12.6 12.0 - 15.0 g/dL  HCT 38.6 36.0 - 46.0 %   MCV 90.8 80.0 - 100.0 fL   MCH 29.6 26.0 - 34.0 pg   MCHC 32.6 30.0 - 36.0 g/dL   RDW 86.2 88.4 - 84.4 %   Platelets 274 150 - 400 K/uL   nRBC 0.0 0.0 - 0.2 %  Hemoglobin A1c     Status: None   Collection Time: 12/04/23  6:58 PM  Result Value Ref Range   Hgb A1c MFr Bld 5.0 4.8 - 5.6 %   Mean Plasma Glucose 96.8 mg/dL  Comprehensive metabolic panel     Status: Abnormal   Collection Time: 12/04/23  6:58 PM  Result Value Ref Range   Sodium 134 (L) 135 - 145 mmol/L   Potassium 3.9 3.5 - 5.1 mmol/L   Chloride 104 98 - 111 mmol/L   CO2 20 (L) 22 - 32 mmol/L   Glucose, Bld 106 (H) 70 - 99 mg/dL   BUN 9 6 - 20 mg/dL   Creatinine, Ser 9.32 0.44 - 1.00 mg/dL   Calcium 9.3 8.9 -  89.6 mg/dL   Total Protein 6.5 6.5 - 8.1 g/dL   Albumin 2.8 (L) 3.5 - 5.0 g/dL   AST 17 15 - 41 U/L   ALT 13 0 - 44 U/L   Alkaline Phosphatase 81 38 - 126 U/L   Total Bilirubin 0.5 0.0 - 1.2 mg/dL   GFR, Estimated >39 >39 mL/min   Anion gap 10 5 - 15     Assessment/Plan: 1) Admit 2) Diabetes coordinator consultation 3) NPH 10 units QAM/at bedtime, SSI for meal coverage 4) CBG Fasting and 2 hrs PP 5) HgbA1C 6) US  for EFW, last US  for growth 11/13/23 US  EFW/BPP/AFI: vtx, 2060 gm (4#9) 57%, AFI 13.7cm, FHR 152, BPP 8/8  Alexandra Ayala 12/04/2023, 8:16 PM

## 2023-12-05 ENCOUNTER — Telehealth (HOSPITAL_COMMUNITY): Payer: Self-pay | Admitting: Pharmacy Technician

## 2023-12-05 ENCOUNTER — Inpatient Hospital Stay (HOSPITAL_COMMUNITY)

## 2023-12-05 ENCOUNTER — Other Ambulatory Visit (HOSPITAL_COMMUNITY): Payer: Self-pay

## 2023-12-05 DIAGNOSIS — Z363 Encounter for antenatal screening for malformations: Secondary | ICD-10-CM

## 2023-12-05 DIAGNOSIS — O24414 Gestational diabetes mellitus in pregnancy, insulin controlled: Secondary | ICD-10-CM | POA: Diagnosis not present

## 2023-12-05 DIAGNOSIS — Z3A35 35 weeks gestation of pregnancy: Secondary | ICD-10-CM | POA: Diagnosis not present

## 2023-12-05 DIAGNOSIS — O24424 Gestational diabetes mellitus in childbirth, insulin controlled: Secondary | ICD-10-CM

## 2023-12-05 LAB — GLUCOSE, CAPILLARY
Glucose-Capillary: 113 mg/dL — ABNORMAL HIGH (ref 70–99)
Glucose-Capillary: 127 mg/dL — ABNORMAL HIGH (ref 70–99)
Glucose-Capillary: 109 mg/dL — ABNORMAL HIGH (ref 70–99)
Glucose-Capillary: 144 mg/dL — ABNORMAL HIGH (ref 70–99)
Glucose-Capillary: 146 mg/dL — ABNORMAL HIGH (ref 70–99)
Glucose-Capillary: 124 mg/dL — ABNORMAL HIGH (ref 70–99)

## 2023-12-05 LAB — TYPE AND SCREEN
ABO/RH(D): O POS
Antibody Screen: NEGATIVE

## 2023-12-05 MED ORDER — INSULIN NPH (HUMAN) (ISOPHANE) 100 UNIT/ML ~~LOC~~ SUSP
13.0000 [IU] | Freq: Every day | SUBCUTANEOUS | Status: DC
  Administered 2023-12-05 – 2023-12-06 (×2): 13 [IU] via SUBCUTANEOUS

## 2023-12-05 MED ORDER — ASPIRIN 81 MG PO TBEC
81.0000 mg | DELAYED_RELEASE_TABLET | Freq: Every day | ORAL | Status: DC
  Administered 2023-12-05 – 2023-12-07 (×3): 81 mg via ORAL
  Filled 2023-12-05 (×3): qty 1

## 2023-12-05 MED ORDER — INSULIN NPH (HUMAN) (ISOPHANE) 100 UNIT/ML ~~LOC~~ SUSP
11.0000 [IU] | Freq: Every day | SUBCUTANEOUS | Status: DC
  Administered 2023-12-06 – 2023-12-07 (×2): 11 [IU] via SUBCUTANEOUS

## 2023-12-05 NOTE — Discharge Instructions (Signed)
 It is recommended that Alexandra Ayala does not travel outside of the [place] area for the remainder of her pregnancy.

## 2023-12-05 NOTE — Inpatient Diabetes Management (Addendum)
 Inpatient Diabetes Program Recommendations  ADA Standards of Care 2025 Diabetes in Pregnancy Target Glucose Ranges:  Fasting: 70 - 95 mg/dL 1 hr postprandial:  889 - 140mg /dL (from first bite of meal) 2 hr postprandial:  100 - 120 mg/dL (from first bit of meal)    Lab Results  Component Value Date   GLUCAP 127 (H) 12/05/2023   HGBA1C 5.0 12/04/2023    Review of Glycemic Control  Latest Reference Range & Units 12/04/23 22:04 12/05/23 08:18 12/05/23 11:34  Glucose-Capillary 70 - 99 mg/dL 876 (H) 886 (H) 872 (H)   Diabetes history: GDM Outpatient Diabetes medications:  Lantus 3 units q HS Current orders for Inpatient glycemic control:  Novolog 0-16 units tid after meals NPH 10 units bid Inpatient Diabetes Program Recommendations:    Met with patient at bedside to discuss GDM and issues with management. She states that she has had good control post prandial but her fasting CBG's tend to be >100 mg/dL.  She had some confusion regarding which insulins she was supposed to be taking.  We discussed glucose goals during pregnancy.  She is currently using glucometer for fingersticks.  Discussed use of CGM for closer monitoring at home. She is interested in trying CGM.  Explained that fingersticks are warranted at times and that there is a 10 minute lag between CBG and CGM.  Order received to place CGM in hospital however MD still wants blood sugars checked 4 times a day.   MD ordered application of Freestyle CGM for patient. Education done regarding application and changing CGM sensor (alternate every 15 days on back of arms), 1 hour warm-up, use of glucometer when alert displays, how to scan CGM for glucose reading and information for PCP. Patient has also been given educational packet regarding use CGM sensor including the 1-800 toll free number for any questions, problems or needs related to the [facility] sensors or reader.    Sensor applied by patient to (L) Arm at 12:30p.  Explained  that glucose readings will not be available until 1 hour after application. Reviewed use of CGM including how to scan, changing Sensor, Vitamin C warning, arrows with glucose readings, and Freestyle app.  Gave patient an extra CGM so it will cover 30 days. Should not need order for more CGMs.   Left copy of gestational DM educational booklet for patient as well.  Patient is very engaged and asking great questions. Will follow.   Thanks,  Randall Bullocks, RN, BC-ADM Inpatient Diabetes Coordinator Pager [phone]

## 2023-12-05 NOTE — Telephone Encounter (Signed)
 Patient Product/process development scientist completed.    The patient is insured through [facility] [place].     Ran test claim for Dexcom G7 Sensor and Requires Prior Authorization  Ran test claim for [employer] 3 Plus Sensor and Requires Prior Authorization  This test claim was processed through [employer]- copay amounts may vary at other pharmacies due to [employer], or as the patient moves through the different stages of their insurance plan.     Reyes Sharps, CPHT Pharmacy Technician III Certified Patient Advocate [facility] Patient Advocate Team Direct Number: [phone]  Fax: [phone]

## 2023-12-05 NOTE — Progress Notes (Signed)
 Alexandra Ayala is a 28 y.o. G1P0000 at [redacted]w[redacted]d admitted for glycemic control in the setting gDMA2.  Subjective: Kaliopi is doing well. Reports good fetal movement. Her mom is at the bedside.  Objective:    12/05/2023    8:01 PM 12/05/2023    4:16 PM 12/05/2023   11:36 AM  Vitals with BMI  Systolic 136 123 872  Diastolic 79 66 60  Pulse 98 102 109     FHT:  FHR: 130 bpm, variability: moderate,  accelerations:  Present,  decelerations:  Present one variable during  40 minutes of monitoring UC:   patient movement noted, no contractions   Labs: Lab Results  Component Value Date   WBC 7.7 12/04/2023   HGB 12.6 12/04/2023   HCT 38.6 12/04/2023   MCV 90.8 12/04/2023   PLT 274 12/04/2023    Assessment / Plan: Saia Derossett is a 28 year old G1P0 at [redacted]w[redacted]d admitted for glucose control in the setting of poorly monitored gDMA2 1) gDMA2: HgbA1C on admission 5.0, although could be erroneously low in pregnancy. Diabetic coordinator consulted and recommended CGM. We appreciate their assistance. Although CGM is in place, fasting and 2 hour postprandial CBGs should still be performed and documented by RN staff. Initially NPH 10u BID, SSI for meal coverage was ordered. Fasting elevated at 113 this morning, increase to NPH 13u at bedtime. 3 out of 3 postprandial values mildly elevated, will increase to NPH 11u in the morning. Patient hopes to learn more about carb counting from diabetic coordinator tomorrow. 2) FWB: Daily NST. 8/8 BPP on 6/26.  3) LGA: EFW 3187g/7lb (94%), AC 98% on 6/26. 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 None in the outpatient setting. Does not currently meet criteria for gHTN.  8) PNC: collect GBS at 36w.  Dispo: Continue observation, discharge pending glucose control.     Rubie DELENA Husky, MD 12/05/2023, 9:56 PM

## 2023-12-05 NOTE — Plan of Care (Signed)

## 2023-12-05 NOTE — Plan of Care (Signed)
  Problem: Education: Goal: Knowledge of General Education information will improve Description: Including pain rating scale, medication(s)/side effects and non-pharmacologic comfort measures Outcome: Progressing   Problem: Health Behavior/Discharge Planning: Goal: Ability to manage health-related needs will improve Outcome: Progressing   Problem: Clinical Measurements: Goal: Ability to maintain clinical measurements within normal limits will improve Outcome: Progressing Goal: Will remain free from infection Outcome: Progressing Goal: Diagnostic test results will improve Outcome: Progressing Goal: Respiratory complications will improve Outcome: Progressing Goal: Cardiovascular complication will be avoided Outcome: Progressing   Problem: Activity: Goal: Risk for activity intolerance will decrease Outcome: Progressing   Problem: Nutrition: Goal: Adequate nutrition will be maintained Outcome: Progressing   Problem: Coping: Goal: Level of anxiety will decrease Outcome: Progressing   Problem: Elimination: Goal: Will not experience complications related to bowel motility Outcome: Progressing Goal: Will not experience complications related to urinary retention Outcome: Progressing   Problem: Pain Managment: Goal: General experience of comfort will improve and/or be controlled Outcome: Progressing   Problem: Safety: Goal: Ability to remain free from injury will improve Outcome: Progressing   Problem: Skin Integrity: Goal: Risk for impaired skin integrity will decrease Outcome: Progressing   Problem: Education: Goal: Ability to describe self-care measures that may prevent or decrease complications (Diabetes Survival Skills Education) will improve Outcome: Progressing Goal: Individualized Educational Video(s) Outcome: Progressing   Problem: Coping: Goal: Ability to adjust to condition or change in health will improve Outcome: Progressing   Problem: Fluid  Volume: Goal: Ability to maintain a balanced intake and output will improve Outcome: Progressing   Problem: Health Behavior/Discharge Planning: Goal: Ability to identify and utilize available resources and services will improve Outcome: Progressing Goal: Ability to manage health-related needs will improve Outcome: Progressing   Problem: Metabolic: Goal: Ability to maintain appropriate glucose levels will improve Outcome: Progressing   Problem: Nutritional: Goal: Maintenance of adequate nutrition will improve Outcome: Progressing Goal: Progress toward achieving an optimal weight will improve Outcome: Progressing   Problem: Skin Integrity: Goal: Risk for impaired skin integrity will decrease Outcome: Progressing   Problem: Tissue Perfusion: Goal: Adequacy of tissue perfusion will improve Outcome: Progressing   Problem: Education: Goal: Knowledge of disease or condition will improve Outcome: Progressing Goal: Knowledge of the prescribed therapeutic regimen will improve Outcome: Progressing Goal: Individualized Educational Video(s) Outcome: Progressing   Problem: Clinical Measurements: Goal: Complications related to the disease process, condition or treatment will be avoided or minimized Outcome: Progressing

## 2023-12-06 DIAGNOSIS — O24414 Gestational diabetes mellitus in pregnancy, insulin controlled: Secondary | ICD-10-CM | POA: Diagnosis not present

## 2023-12-06 LAB — GLUCOSE, CAPILLARY
Glucose-Capillary: 104 mg/dL — ABNORMAL HIGH (ref 70–99)
Glucose-Capillary: 105 mg/dL — ABNORMAL HIGH (ref 70–99)
Glucose-Capillary: 128 mg/dL — ABNORMAL HIGH (ref 70–99)
Glucose-Capillary: 138 mg/dL — ABNORMAL HIGH (ref 70–99)
Glucose-Capillary: 164 mg/dL — ABNORMAL HIGH (ref 70–99)

## 2023-12-06 MED ORDER — SODIUM CHLORIDE 0.9% FLUSH
3.0000 mL | Freq: Two times a day (BID) | INTRAVENOUS | Status: DC
  Administered 2023-12-06 – 2023-12-07 (×3): 3 mL via INTRAVENOUS

## 2023-12-06 NOTE — Inpatient Diabetes Management (Signed)
 Inpatient Diabetes Program Recommendations  ADA Standards of Care 2025 Diabetes in Pregnancy Target Glucose Ranges:  Fasting: 70 - 95 mg/dL 1 hr postprandial:  889 - 140mg /dL (from first bite of meal) 2 hr postprandial:  100 - 120 mg/dL (from first bit of meal)    Lab Results  Component Value Date   GLUCAP 105 (H) 12/06/2023   HGBA1C 5.0 12/04/2023    Review of Glycemic Control  Latest Reference Range & Units 12/05/23 14:55 12/05/23 18:03 12/05/23 18:15 12/05/23 21:50 12/06/23 08:39 12/06/23 12:28  Glucose-Capillary 70 - 99 mg/dL 890 (H) 855 (H) 853 (H) 124 (H) 104 (H) 105 (H)   Diabetes history: GDM Current orders for Inpatient glycemic control:  NPH 11 units q AM and NPH 13 units q PM Novolog  0-16 units tid 2 hour postprandial  Inpatient Diabetes Program Recommendations:    Note that CBG's are within goal today.  Patient spoke with dietician regarding CHO counting.   If 2 hour post-prandial CBG's>120 mg/dL, may need low dose Novolog  meal coverage added prior to meals to prevent spikes in CBG's after eating.  Explained this concept to patient.  She states that CGM is working well and is helpful.   Should be able to discharge soon.   Thanks,  Randall Bullocks, RN, BC-ADM Inpatient Diabetes Coordinator Pager [phone]  (8a-5p)

## 2023-12-06 NOTE — Progress Notes (Signed)
 Alexandra Ayala is a 28 y.o. G1P0000 at [redacted]w[redacted]d admitted for glycemic control in the setting gDMA2.  Subjective: Alexandra Ayala is doing well. Reports good fetal movement.  Objective:    12/05/2023   11:45 PM 12/05/2023    8:01 PM 12/05/2023    4:16 PM  Vitals with BMI  Systolic 123 136 876  Diastolic 60 79 66  Pulse 112 98 102     FHT:  FHR: 125 bpm, variability: moderate,  accelerations:  Present,  decelerations:  Absent 40 minutes of monitoring UC:   patient movement noted, no contractions   Labs: Lab Results  Component Value Date   WBC 7.7 12/04/2023   HGB 12.6 12/04/2023   HCT 38.6 12/04/2023   MCV 90.8 12/04/2023   PLT 274 12/04/2023    Assessment / Plan: Alexandra Ayala is a 28 year old G1P0 at [redacted]w[redacted]d admitted for glucose control in the setting of poorly monitored gDMA2 1) gDMA2: HgbA1C on admission 5.0, although could be erroneously low in pregnancy. Diabetic coordinator consulted and recommended CGM. We appreciate their assistance. Although CGM is in place, fasting and 2 hour postprandial CBGs should still be performed and documented by RN staff. Currently on NPH 11u/13u split. Dinner BG elevated last night. Fasting this AM 104, improved from yesterday's 113 but still with room for improvement. Patient hopes to learn more about carb counting from diabetic coordinator today. 2) FWB: Daily NST. 8/8 BPP on 6/26.  3) LGA: EFW 3187g/7lb (94%), AC 98% on 6/26. 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 None in the outpatient setting. Does not currently meet criteria for gHTN. Normotensive since admission 8) PNC: collect GBS at 36w.  Dispo: Continue observation, discharge pending glucose control.     Alexandra CHRISTELLA Guppy, MD 12/06/2023, 11:01 AM

## 2023-12-06 NOTE — Plan of Care (Signed)
 Nutrition Education Consult  RD consulted for nutrition education regarding nutrition for GDM (pt requested to learn more about CHO counting)  28 y.o. G1P0000 at [redacted]w[redacted]d  Adm diagnosis: glycemic control in the setting GDMA2   Pre-pregnancy weight: 185 lbs (84.1 kg)  Medications: Colace 100 mg daily, NPH 11 units QAC breakfast and 13 units at bedtime, Novolog  0-16 units TID, prenatal MVI  Labs: Lab Results  Component Value Date   HGBA1C 5.0 12/04/2023    Typical dietary intake: Breakfast: apple with peanut butter and cheese stick or yogurt Lunch: skips or has tuna sandwich with a pickle Dinner: ground [place] or ground beef with bell peppers and onions and brown rice Snacks: cheese with crackers or fruit with boiled egg Beverages: typically only drinks water; occasionally has a sugar-free beverage such as sparkling water   RD provided My Food Plan for Gestational Diabetes handout from the [facility]. Discussed different food groups and their effects on blood sugar, emphasizing carbohydrate-containing foods. Provided list of carbohydrates and recommended serving sizes of common foods.  Discussed importance of controlled and consistent carbohydrate intake throughout the day. Provided examples of ways to balance meals/snacks and encouraged intake of high-fiber, whole grain complex carbohydrates.  Encouraged intake of adequate protein at meals and snacks. Discussed continuing to limit sugar-sweetened beverages. Teach back method used.  Recommended Nutrition Prescription: Breakfast: 30-45 grams carbohydrates Morning Snack: 15-30 grams carbohydrates Lunch: 50-60 grams carbohydrates Afternoon Snack: 15-30 grams carbohydrates Dinner: 50-60 grams of carbohydrates Evening Snack: 15-30 grams of carbohydrates  Expect good to excellent compliance.  Nutrition Dx: Food and nutrition-related knowledge deficit r/t limited comprehension of previous education aeb pt  report.  Current diet order is gestational carbohydrate modified. Patient may have double protein portions at meals if requested. Labs and medications reviewed. No further nutrition interventions warranted at this time. If additional nutrition issues arise, please re-consult RD   Damien Myrna Pyo, MS, RD, LDN, CNSC If unable to reach, please contact NICU RD secure chat group between 7 am-3 pm daily

## 2023-12-07 DIAGNOSIS — O24414 Gestational diabetes mellitus in pregnancy, insulin controlled: Secondary | ICD-10-CM | POA: Diagnosis not present

## 2023-12-07 LAB — GLUCOSE, CAPILLARY: Glucose-Capillary: 100 mg/dL — ABNORMAL HIGH (ref 70–99)

## 2023-12-07 MED ORDER — INSULIN ASPART 100 UNIT/ML IJ SOLN: 0.0000 [IU] | Freq: Three times a day (TID) | INTRAMUSCULAR | Status: DC

## 2023-12-07 NOTE — Inpatient Diabetes Management (Signed)
 Inpatient Diabetes Program Recommendations  Diabetes Treatment Program Recommendations  ADA Standards of Care Diabetes in Pregnancy Target Glucose Ranges:  Fasting: 70 - 95 mg/dL 1 hr postprandial: Less than 140mg /dL (from first bite of meal) 2 hr postprandial: Less than 120 mg/dL (from first bite of meal)     Latest Reference Range & Units 12/06/23 08:39 12/06/23 12:28 12/06/23 15:21 12/06/23 20:32 12/06/23 23:22  Glucose-Capillary 70 - 99 mg/dL 895 (H) 894 (H) 871 (H) 138 (H) 164 (H)   Review of Glycemic Control  Current orders for Inpatient glycemic control: NPH 11 units QAM, NPH 13 units QPM, Novolog  0-16 units TID (2H PP)  Inpatient Diabetes Program Recommendations:    Insulin : Please consider ordering Novolog  3 units TID with meals for meal coverage if patient eats at least 50% of meals.  NOTE: Noted consult for Diabetes Coordinator. Diabetes Coordinator is not on campus over the weekend but available by pager from 8am to 5pm for questions or concerns.   Thanks,  Earnie Gainer, RN, MSN, CDCES Diabetes Coordinator Inpatient Diabetes Program [phone] (Team Pager from 8am to 5pm)

## 2023-12-07 NOTE — Plan of Care (Signed)
  Problem: Education: Goal: Knowledge of General Education information will improve Description: Including pain rating scale, medication(s)/side effects and non-pharmacologic comfort measures Outcome: Progressing   Problem: Health Behavior/Discharge Planning: Goal: Ability to manage health-related needs will improve Outcome: Progressing   Problem: Clinical Measurements: Goal: Ability to maintain clinical measurements within normal limits will improve Outcome: Progressing Goal: Will remain free from infection Outcome: Progressing Goal: Diagnostic test results will improve Outcome: Progressing Goal: Respiratory complications will improve Outcome: Progressing Goal: Cardiovascular complication will be avoided Outcome: Progressing   Problem: Activity: Goal: Risk for activity intolerance will decrease Outcome: Progressing   Problem: Nutrition: Goal: Adequate nutrition will be maintained Outcome: Progressing   Problem: Coping: Goal: Level of anxiety will decrease Outcome: Progressing   Problem: Elimination: Goal: Will not experience complications related to bowel motility Outcome: Progressing Goal: Will not experience complications related to urinary retention Outcome: Progressing   Problem: Pain Managment: Goal: General experience of comfort will improve and/or be controlled Outcome: Progressing   Problem: Safety: Goal: Ability to remain free from injury will improve Outcome: Progressing   Problem: Skin Integrity: Goal: Risk for impaired skin integrity will decrease Outcome: Progressing   Problem: Education: Goal: Ability to describe self-care measures that may prevent or decrease complications (Diabetes Survival Skills Education) will improve Outcome: Progressing Goal: Individualized Educational Video(s) Outcome: Progressing   Problem: Coping: Goal: Ability to adjust to condition or change in health will improve Outcome: Progressing   Problem: Fluid  Volume: Goal: Ability to maintain a balanced intake and output will improve Outcome: Progressing   Problem: Health Behavior/Discharge Planning: Goal: Ability to identify and utilize available resources and services will improve Outcome: Progressing Goal: Ability to manage health-related needs will improve Outcome: Progressing   Problem: Metabolic: Goal: Ability to maintain appropriate glucose levels will improve Outcome: Progressing   Problem: Nutritional: Goal: Maintenance of adequate nutrition will improve Outcome: Progressing Goal: Progress toward achieving an optimal weight will improve Outcome: Progressing   Problem: Skin Integrity: Goal: Risk for impaired skin integrity will decrease Outcome: Progressing   Problem: Tissue Perfusion: Goal: Adequacy of tissue perfusion will improve Outcome: Progressing   Problem: Education: Goal: Knowledge of disease or condition will improve Outcome: Progressing Goal: Knowledge of the prescribed therapeutic regimen will improve Outcome: Progressing Goal: Individualized Educational Video(s) Outcome: Progressing   Problem: Clinical Measurements: Goal: Complications related to the disease process, condition or treatment will be avoided or minimized Outcome: Progressing

## 2023-12-07 NOTE — Progress Notes (Signed)
 Adelyne Marchese is a 28 y.o. G1P0000 at [redacted]w[redacted]d admitted for glycemic control in the setting gDMA2.  Subjective: Micaiah is doing well. Reports good fetal movement.  Objective:    12/07/2023   11:28 AM 12/07/2023    8:48 AM 12/06/2023   11:31 PM  Vitals with BMI  Systolic 126 112 871  Diastolic 69 69 66  Pulse 108 105 103     FHT:  FHR: 130 bpm, variability: moderate,  accelerations:  Present,  decelerations:  Absent  UC:   patient movement noted, no contractions   Labs: Lab Results  Component Value Date   WBC 7.7 12/04/2023   HGB 12.6 12/04/2023   HCT 38.6 12/04/2023   MCV 90.8 12/04/2023   PLT 274 12/04/2023    Latest Reference Range & Units 12/06/23 08:39 12/06/23 12:28 12/06/23 15:21 12/06/23 20:32 12/06/23 23:22 12/07/23 08:53  Glucose-Capillary 70 - 99 mg/dL 895 (H) 894 (H) 871 (H) 138 (H) 164 (H) 100 (H)  (H): Data is abnormally high   Assessment / Plan: Bijou Easler is a 28 year old G1P0 at [redacted]w[redacted]d admitted for glucose control in the setting of poorly monitored gDMA2 1) gDMA2: HgbA1C on admission 5.0, although could be erroneously low in pregnancy. Diabetic coordinator consulted and recommended CGM. We appreciate their assistance. Although CGM is in place, fasting and 2 hour postprandial CBGs should still be performed and documented by RN staff.  - Glucose improved on NPH 11u/13u split, still mildly elevated fasting and postprandials, will increase NPH to 13U AM/15U PM. Patient feels more confident in GDM management after meeting with diabetes RN.  2) FWB: Daily NST. 8/8 BPP on 6/26.  3) LGA: EFW 3187g/7lb (94%), AC 98% on 6/26. 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 None in the outpatient setting. Does not currently meet criteria for gHTN. Normotensive since admission 8) PNC: collect GBS at 36w.  Dispo: sugars still mildly elevated but much closer to goal. She is now a good candidate  for outpatient management. We discussed discharge on NPH 13/15 with follow up in office Tuesday to review log. She agrees to plan. Discharge order placed.     Rosaline FORBES Chapel, MD 12/07/2023, 12:10 PM

## 2023-12-07 NOTE — Discharge Summary (Signed)
 Physician Discharge Summary  Patient ID: Alexandra Ayala MRN: [id number] DOB/AGE: 1995-09-01 28 y.o.  Admit date: 12/04/2023 Discharge date: 12/07/2023  Admission Diagnoses: Poorly controlled gestational diabetes, A2  Discharge Diagnoses:  Principal Problem:   White classification A2 gestational diabetes mellitus (GDM), insulin  controlled Active Problems:   Gestational diabetes mellitus in childbirth, insulin  controlled   Discharged Condition: good  Hospital Course:  6/25: Patient admitted for glucose regulation, started on NPH 10U BID and insulin  sliding scale. Elevated BP noted on admission.  6/26: Regimen increased to NPH 11U in AM and 13U in PM. Growth US  obtained which showed EFW 3187g/7lb (94%), AC 98%. 6/27: Diabetes coordinator consulted, as well as dietitian and provided education.  6/28: Glucose still slightly elevated but much closer to goal. Patient discharged home with increase in NPH to 13U in AM and 15U in PM. She will keep log and return to office early next week to review. Remained normotensive during the remainder of admission so did not meet gestational hypertension criteria.     Consults: Registered Dietitian, Diabetes Coordinator  Significant Diagnostic Studies:  6/26 Growth US : EFW 3187g/7lb (94%), AC 98%   Treatments: Insulin  - NPH  Discharge Exam: Blood pressure 112/69, pulse (!) 105, temperature (!) 97.5 F (36.4 C), temperature source Oral, resp. rate 18, height 5' 4.02 (1.626 m), weight 98.9 kg, SpO2 99%. Gen: well appearing CVS: normal pulses Lungs: nonlabored respirations Abd: gravid Ext: no calf edema or tenderness     Disposition: Discharge disposition: 01-Home or Self Care       Discharge Instructions     Discharge activity:  No Restrictions   Complete by: As directed    Discharge diet:   Complete by: As directed    Gestational diabetes diet   LABOR:  When conractions begin, you should start to time them from the beginning  of one contraction to the beginning  of the next.  When contractions are 5 - 10 minutes apart or less and have been regular for at least an hour, you should call your health care provider.   Complete by: As directed    Notify physician for bleeding from the vagina   Complete by: As directed    Notify physician for blurring of vision or spots before the eyes   Complete by: As directed    Notify physician for chills or fever   Complete by: As directed    Notify physician for fainting spells, black outs or loss of consciousness   Complete by: As directed    Notify physician for increase in vaginal discharge   Complete by: As directed    Notify physician for leaking of fluid   Complete by: As directed    Notify physician for pain or burning when urinating   Complete by: As directed    Notify physician for pelvic pressure (sudden increase)   Complete by: As directed    Notify physician for severe or continued nausea or vomiting   Complete by: As directed    Notify physician for sudden gushing of fluid from the vagina (with or without continued leaking)   Complete by: As directed    Notify physician for sudden, constant, or occasional abdominal pain   Complete by: As directed    Notify physician if baby moving less than usual   Complete by: As directed       Allergies as of 12/07/2023   No Known Allergies      Medication List     STOP  taking these medications    insulin  glargine 100 UNIT/ML Solostar Pen Commonly known as: LANTUS        TAKE these medications    acyclovir 800 MG tablet Commonly known as: ZOVIRAX Take 800 mg by mouth 2 (two) times daily.   albuterol 108 (90 Base) MCG/ACT inhaler Commonly known as: VENTOLIN HFA Inhale 2 puffs into the lungs every 6 (six) hours as needed for wheezing or shortness of breath.   aspirin  EC 81 MG tablet Take 81 mg by mouth daily. Swallow whole.   cetirizine 10 MG tablet Commonly known as: ZYRTEC Take 10 mg by mouth  daily.   insulin  NPH Human 100 UNIT/ML injection Commonly known as: NOVOLIN N Inject 0.15 mLs (15 Units total) into the skin at bedtime.   insulin  NPH Human 100 UNIT/ML injection Commonly known as: NOVOLIN N Inject 0.13 mLs (13 Units total) into the skin daily before breakfast. Start taking on: December 08, 2023   multivitamin-prenatal 27-0.8 MG Tabs tablet Take 1 tablet by mouth daily at 12 noon.   nitrofurantoin  (macrocrystal-monohydrate) 100 MG capsule Commonly known as: MACROBID  Take 1 capsule (100 mg total) by mouth 2 (two) times daily.        Follow-up Information     Diedre Rosaline BRAVO, MD. Go on 12/10/2023.   Specialty: Obstetrics and Gynecology Why: Tuesday 7/1 at 3:30pm Contact information: [address] [place] [address] [phone]                 Signed: Rosaline BRAVO Diedre 12/07/2023, 11:08 AM

## 2023-12-11 LAB — OB RESULTS CONSOLE GBS: GBS: NEGATIVE

## 2023-12-23 ENCOUNTER — Telehealth (HOSPITAL_COMMUNITY): Payer: Self-pay | Admitting: *Deleted

## 2023-12-23 ENCOUNTER — Encounter (HOSPITAL_COMMUNITY): Payer: Self-pay | Admitting: *Deleted

## 2023-12-23 NOTE — Telephone Encounter (Signed)
 Preadmission screen

## 2023-12-24 ENCOUNTER — Inpatient Hospital Stay (HOSPITAL_COMMUNITY)

## 2023-12-25 ENCOUNTER — Inpatient Hospital Stay (HOSPITAL_COMMUNITY): Admitting: Anesthesiology

## 2023-12-25 ENCOUNTER — Inpatient Hospital Stay (HOSPITAL_COMMUNITY)
Admission: AD | Admit: 2023-12-25 | Discharge: 2023-12-28 | DRG: 806 | Disposition: A | Discharge status: Home / Self Care | Attending: Obstetrics and Gynecology | Admitting: Obstetrics and Gynecology

## 2023-12-25 ENCOUNTER — Encounter (HOSPITAL_COMMUNITY): Payer: Self-pay | Admitting: Obstetrics and Gynecology

## 2023-12-25 DIAGNOSIS — Z3A38 38 weeks gestation of pregnancy: Secondary | ICD-10-CM | POA: Diagnosis not present

## 2023-12-25 DIAGNOSIS — O139 Gestational [pregnancy-induced] hypertension without significant proteinuria, unspecified trimester: Secondary | ICD-10-CM | POA: Diagnosis not present

## 2023-12-25 DIAGNOSIS — A6000 Herpesviral infection of urogenital system, unspecified: Secondary | ICD-10-CM | POA: Diagnosis present

## 2023-12-25 DIAGNOSIS — O368130 Decreased fetal movements, third trimester, not applicable or unspecified: Secondary | ICD-10-CM | POA: Diagnosis present

## 2023-12-25 DIAGNOSIS — O9952 Diseases of the respiratory system complicating childbirth: Secondary | ICD-10-CM | POA: Diagnosis present

## 2023-12-25 DIAGNOSIS — O24424 Gestational diabetes mellitus in childbirth, insulin controlled: Secondary | ICD-10-CM | POA: Diagnosis present

## 2023-12-25 DIAGNOSIS — O99214 Obesity complicating childbirth: Secondary | ICD-10-CM | POA: Diagnosis present

## 2023-12-25 DIAGNOSIS — O9832 Other infections with a predominantly sexual mode of transmission complicating childbirth: Secondary | ICD-10-CM | POA: Diagnosis present

## 2023-12-25 DIAGNOSIS — O134 Gestational [pregnancy-induced] hypertension without significant proteinuria, complicating childbirth: Secondary | ICD-10-CM | POA: Diagnosis present

## 2023-12-25 DIAGNOSIS — O26893 Other specified pregnancy related conditions, third trimester: Secondary | ICD-10-CM | POA: Diagnosis present

## 2023-12-25 DIAGNOSIS — O133 Gestational [pregnancy-induced] hypertension without significant proteinuria, third trimester: Secondary | ICD-10-CM | POA: Diagnosis present

## 2023-12-25 DIAGNOSIS — J45909 Unspecified asthma, uncomplicated: Secondary | ICD-10-CM | POA: Diagnosis present

## 2023-12-25 LAB — CBC
HCT: 40.4 % (ref 36.0–46.0)
HCT: 40.5 % (ref 36.0–46.0)
Hemoglobin: 13.3 g/dL (ref 12.0–15.0)
Hemoglobin: 13.5 g/dL (ref 12.0–15.0)
MCH: 29.6 pg (ref 26.0–34.0)
MCH: 30.4 pg (ref 26.0–34.0)
MCHC: 32.9 g/dL (ref 30.0–36.0)
MCHC: 33.3 g/dL (ref 30.0–36.0)
MCV: 90 fL (ref 80.0–100.0)
MCV: 91.2 fL (ref 80.0–100.0)
Platelets: 249 K/uL (ref 150–400)
Platelets: 234 K/uL (ref 150–400)
RBC: 4.49 MIL/uL (ref 3.87–5.11)
RBC: 4.44 MIL/uL (ref 3.87–5.11)
RDW: 13.8 % (ref 11.5–15.5)
RDW: 13.8 % (ref 11.5–15.5)
WBC: 7.9 K/uL (ref 4.0–10.5)
WBC: 9.8 K/uL (ref 4.0–10.5)
nRBC: 0 % (ref 0.0–0.2)
nRBC: 0.2 % (ref 0.0–0.2)

## 2023-12-25 LAB — URINALYSIS, ROUTINE W REFLEX MICROSCOPIC
Bilirubin Urine: NEGATIVE
Glucose, UA: NEGATIVE mg/dL
Hgb urine dipstick: NEGATIVE
Ketones, ur: NEGATIVE mg/dL
Leukocytes,Ua: NEGATIVE
Nitrite: NEGATIVE
Protein, ur: NEGATIVE mg/dL
Specific Gravity, Urine: 1.01 (ref 1.005–1.030)
pH: 6 (ref 5.0–8.0)

## 2023-12-25 LAB — TYPE AND SCREEN
ABO/RH(D): O POS
Antibody Screen: NEGATIVE

## 2023-12-25 LAB — COMPREHENSIVE METABOLIC PANEL WITH GFR
ALT: 18 U/L (ref 0–44)
AST: 17 U/L (ref 15–41)
Albumin: 2.7 g/dL — ABNORMAL LOW (ref 3.5–5.0)
Alkaline Phosphatase: 109 U/L (ref 38–126)
Anion gap: 14 (ref 5–15)
BUN: 12 mg/dL (ref 6–20)
CO2: 19 mmol/L — ABNORMAL LOW (ref 22–32)
Calcium: 9.9 mg/dL (ref 8.9–10.3)
Chloride: 103 mmol/L (ref 98–111)
Creatinine, Ser: 0.76 mg/dL (ref 0.44–1.00)
GFR, Estimated: >60 mL/min (ref >60)
Glucose, Bld: 99 mg/dL (ref 70–99)
Potassium: 3.9 mmol/L (ref 3.5–5.1)
Sodium: 136 mmol/L (ref 135–145)
Total Bilirubin: 0.5 mg/dL (ref 0.0–1.2)
Total Protein: 6.4 g/dL — ABNORMAL LOW (ref 6.5–8.1)

## 2023-12-25 LAB — GLUCOSE, CAPILLARY
Glucose-Capillary: 112 mg/dL — ABNORMAL HIGH (ref 70–99)
Glucose-Capillary: 56 mg/dL — ABNORMAL LOW (ref 70–99)
Glucose-Capillary: 66 mg/dL — ABNORMAL LOW (ref 70–99)
Glucose-Capillary: 93 mg/dL (ref 70–99)
Glucose-Capillary: 104 mg/dL — ABNORMAL HIGH (ref 70–99)

## 2023-12-25 LAB — PROTEIN / CREATININE RATIO, URINE
Creatinine, Urine: 72 mg/dL
Protein Creatinine Ratio: 0.15 mg/mg{creat} (ref 0.00–0.15)
Total Protein, Urine: 11 mg/dL

## 2023-12-25 MED ORDER — PHENYLEPHRINE 80 MCG/ML (10ML) SYRINGE FOR IV PUSH (FOR BLOOD PRESSURE SUPPORT): 80.0000 ug | PREFILLED_SYRINGE | INTRAVENOUS | Status: DC | PRN

## 2023-12-25 MED ORDER — EPHEDRINE 5 MG/ML INJ: 10.0000 mg | INTRAVENOUS | Status: DC | PRN

## 2023-12-25 MED ORDER — LACTATED RINGERS IV SOLN
500.0000 mL | Freq: Once | INTRAVENOUS | Status: AC
Start: 2023-12-25 — End: 2023-12-25
  Administered 2023-12-25: 500 mL via INTRAVENOUS

## 2023-12-25 MED ORDER — LIDOCAINE HCL (PF) 1 % IJ SOLN
30.0000 mL | INTRAMUSCULAR | Status: DC | PRN
  Filled 2023-12-25: qty 30

## 2023-12-25 MED ORDER — FENTANYL-BUPIVACAINE-NACL 0.5-0.125-0.9 MG/250ML-% EP SOLN: 12.0000 mL/h | EPIDURAL | Status: DC | PRN

## 2023-12-25 MED ORDER — OXYTOCIN-SODIUM CHLORIDE 30-0.9 UT/500ML-% IV SOLN
2.5000 [IU]/h | INTRAVENOUS | Status: DC
  Administered 2023-12-26: 2.5 [IU]/h via INTRAVENOUS
  Filled 2023-12-25: qty 500

## 2023-12-25 MED ORDER — DIPHENHYDRAMINE HCL 50 MG/ML IJ SOLN: 12.5000 mg | INTRAMUSCULAR | Status: DC | PRN

## 2023-12-25 MED ORDER — OXYTOCIN BOLUS FROM INFUSION
333.0000 mL | Freq: Once | INTRAVENOUS | Status: AC
Start: 2023-12-25 — End: 2023-12-26
  Administered 2023-12-26: 333 mL via INTRAVENOUS

## 2023-12-25 MED ORDER — OXYTOCIN-SODIUM CHLORIDE 30-0.9 UT/500ML-% IV SOLN: 1.0000 m[IU]/min | INTRAVENOUS | Status: DC

## 2023-12-25 MED ORDER — LABETALOL HCL 5 MG/ML IV SOLN: 80.0000 mg | INTRAVENOUS | Status: DC | PRN

## 2023-12-25 MED ORDER — LABETALOL HCL 5 MG/ML IV SOLN: 40.0000 mg | INTRAVENOUS | Status: DC | PRN

## 2023-12-25 MED ORDER — LACTATED RINGERS IV SOLN: INTRAVENOUS | Status: DC

## 2023-12-25 MED ORDER — LABETALOL HCL 5 MG/ML IV SOLN: 20.0000 mg | INTRAVENOUS | Status: DC | PRN

## 2023-12-25 MED ORDER — TERBUTALINE SULFATE 1 MG/ML IJ SOLN: 0.2500 mg | Freq: Once | INTRAMUSCULAR | Status: DC | PRN

## 2023-12-25 MED ORDER — OXYCODONE-ACETAMINOPHEN 5-325 MG PO TABS: 2.0000 | ORAL_TABLET | ORAL | Status: DC | PRN

## 2023-12-25 MED ORDER — SOD CITRATE-CITRIC ACID 500-334 MG/5ML PO SOLN
30.0000 mL | ORAL | Status: DC | PRN
Start: 2023-12-25 — End: 2023-12-26

## 2023-12-25 MED ORDER — PHENYLEPHRINE 80 MCG/ML (10ML) SYRINGE FOR IV PUSH (FOR BLOOD PRESSURE SUPPORT)
80.0000 ug | PREFILLED_SYRINGE | INTRAVENOUS | Status: DC | PRN
  Filled 2023-12-25: qty 10

## 2023-12-25 MED ORDER — LIDOCAINE-EPINEPHRINE (PF) 2 %-1:200000 IJ SOLN
INTRAMUSCULAR | Status: DC | PRN
  Administered 2023-12-25: 3 mL via EPIDURAL

## 2023-12-25 MED ORDER — OXYTOCIN-SODIUM CHLORIDE 30-0.9 UT/500ML-% IV SOLN
1.0000 m[IU]/min | INTRAVENOUS | Status: DC
  Administered 2023-12-25: 2 m[IU]/min via INTRAVENOUS

## 2023-12-25 MED ORDER — LACTATED RINGERS IV SOLN: 500.0000 mL | Freq: Once | INTRAVENOUS | Status: DC

## 2023-12-25 MED ORDER — FENTANYL-BUPIVACAINE-NACL 0.5-0.125-0.9 MG/250ML-% EP SOLN
12.0000 mL/h | EPIDURAL | Status: DC | PRN
  Administered 2023-12-25: 12 mL/h via EPIDURAL
  Filled 2023-12-25: qty 250

## 2023-12-25 MED ORDER — LACTATED RINGERS IV SOLN: 500.0000 mL | INTRAVENOUS | Status: DC | PRN

## 2023-12-25 MED ORDER — EPHEDRINE 5 MG/ML INJ
10.0000 mg | INTRAVENOUS | Status: DC | PRN
  Filled 2023-12-25: qty 5

## 2023-12-25 MED ORDER — HYDRALAZINE HCL 20 MG/ML IJ SOLN: 10.0000 mg | INTRAMUSCULAR | Status: DC | PRN

## 2023-12-25 MED ORDER — ONDANSETRON HCL 4 MG/2ML IJ SOLN
4.0000 mg | Freq: Four times a day (QID) | INTRAMUSCULAR | Status: DC | PRN
  Administered 2023-12-25: 4 mg via INTRAVENOUS
  Filled 2023-12-25: qty 2

## 2023-12-25 MED ORDER — ACETAMINOPHEN 325 MG PO TABS: 650.0000 mg | ORAL_TABLET | ORAL | Status: DC | PRN

## 2023-12-25 MED ORDER — OXYCODONE-ACETAMINOPHEN 5-325 MG PO TABS: 1.0000 | ORAL_TABLET | ORAL | Status: DC | PRN

## 2023-12-25 MED ORDER — FAMOTIDINE 20 MG PO TABS
20.0000 mg | ORAL_TABLET | Freq: Two times a day (BID) | ORAL | Status: DC
  Administered 2023-12-25 – 2023-12-27 (×5): 20 mg via ORAL
  Filled 2023-12-25 (×4): qty 1

## 2023-12-25 MED ORDER — FENTANYL CITRATE (PF) 100 MCG/2ML IJ SOLN: 50.0000 ug | INTRAMUSCULAR | Status: DC | PRN

## 2023-12-25 NOTE — Anesthesia Procedure Notes (Signed)
 Epidural Patient location during procedure: OB Start time: 12/25/2023 11:19 AM End time: 12/25/2023 11:24 AM  Staffing Anesthesiologist: Tilford Franky BIRCH, MD Performed: anesthesiologist   Preanesthetic Checklist Completed: patient identified, IV checked, site marked, risks and benefits discussed, surgical consent, monitors and equipment checked, pre-op evaluation and timeout performed  Epidural Patient position: sitting Prep: DuraPrep Patient monitoring: heart rate, continuous pulse ox and blood pressure Approach: midline Location: L3-L4 Injection technique: LOR saline  Needle:  Needle type: Tuohy  Needle gauge: 17 G Needle length: 9 cm Catheter type: closed end flexible Catheter size: 20 Guage Test dose: negative and 1.5% lidocaine   Assessment Events: blood not aspirated, no cerebrospinal fluid, injection not painful, no injection resistance and no paresthesia  Additional Notes LOR @ 5.5  Patient identified. Risks/Benefits/Options discussed with patient including but not limited to bleeding, infection, nerve damage, paralysis, failed block, incomplete pain control, headache, blood pressure changes, nausea, vomiting, reactions to medications, itching and postpartum back pain. Confirmed with bedside nurse the patient's most recent platelet count. Confirmed with patient that they are not currently taking any anticoagulation, have any bleeding history or any family history of bleeding disorders. Patient expressed understanding and wished to proceed. All questions were answered. Sterile technique was used throughout the entire procedure. Please see nursing notes for vital signs. Test dose was given through epidural catheter and negative prior to continuing to dose epidural or start infusion. Warning signs of high block given to the patient including shortness of breath, tingling/numbness in hands, complete motor block, or any concerning symptoms with instructions to call for help. Patient  was given instructions on fall risk and not to get out of bed. All questions and concerns addressed with instructions to call with any issues or inadequate analgesia.    Reason for block:procedure for pain

## 2023-12-25 NOTE — Inpatient Diabetes Management (Signed)
 Inpatient Diabetes Program Recommendations  ADA Standards of Care 2025 Diabetes in Pregnancy Target Glucose Ranges:  Fasting: 70 - 95 mg/dL 1 hr postprandial:  889 - 140mg /dL (from first bite of meal) 2 hr postprandial:  100 - 120 mg/dL (from first bit of meal)    Lab Results  Component Value Date   GLUCAP 112 (H) 12/25/2023   HGBA1C 5.0 12/04/2023    Review of Glycemic Control  Latest Reference Range & Units 12/25/23 09:30  Glucose-Capillary 70 - 99 mg/dL 887 (H)   Diabetes history: Gestational DM Outpatient Diabetes medications:  NPH 15 units in AM and NPH 25 units q PM Current orders for Inpatient glycemic control:  CBG's q 4 hours  Inpatient Diabetes Program Recommendations:    Agree with orders.  May consider adding Novolog  0-14 units q 4 hours or if CBG>120 mg/dL x2, may consider IV insulin .   Thanks,  Randall Bullocks, RN, BC-ADM Inpatient Diabetes Coordinator Pager [phone]  (8a-5p)

## 2023-12-25 NOTE — MAU Provider Note (Addendum)
 Chief Complaint:  Abdominal Pain, Pelvic Pain, and Decreased Fetal Movement   Event Date/Time   First Provider Initiated Contact with Patient 12/25/23 0549     HPI: Alexandra Ayala is a 28 y.o. G1P0000 at 35w1dwho presents to maternity admissions reporting painful contractions and decreased fetal movement.  BP noted to be elevated by RN  on Triage. . She reports good fetal movement, denies LOF, vaginal bleeding, vaginal itching/burning, urinary symptoms, h/a, dizziness, n/v, diarrhea, constipation or fever/chills.  She denies headache, visual changes or RUQ abdominal pain.  Abdominal Pain The current episode started today. Nothing aggravates the pain. The pain is relieved by Nothing. She has tried nothing for the symptoms.    Past Medical History: Past Medical History:  Diagnosis Date   Asthma    has inhaler   Environmental and seasonal allergies    Gestational diabetes     Past obstetric history: OB History  Gravida Para Term Preterm AB Living  1 0 0 0 0 0  SAB IAB Ectopic Multiple Live Births  0 0 0 0 0    # Outcome Date GA Lbr Len/2nd Weight Sex Type Anes PTL Lv  1 Current             Past Surgical History: Past Surgical History:  Procedure Laterality Date   NO PAST SURGERIES      Family History: History reviewed. No pertinent family history.  Social History: Social History   Tobacco Use   Smoking status: Never   Smokeless tobacco: Never  Vaping Use   Vaping status: Never Used  Substance Use Topics   Alcohol use: Not Currently    Comment: occasional, last on 8-18   Drug use: Not Currently    Types: Marijuana    Comment: last used 01-28-18    Allergies: No Known Allergies  Meds:  Medications Prior to Admission  Medication Sig Dispense Refill Last Dose/Taking   acyclovir (ZOVIRAX) 800 MG tablet Take 800 mg by mouth 2 (two) times daily.   12/24/2023   aspirin  EC 81 MG tablet Take 81 mg by mouth daily. Swallow whole.   12/24/2023   albuterol (PROVENTIL  HFA;VENTOLIN HFA) 108 (90 Base) MCG/ACT inhaler Inhale 2 puffs into the lungs every 6 (six) hours as needed for wheezing or shortness of breath.      cetirizine (ZYRTEC) 10 MG tablet Take 10 mg by mouth daily. (Patient not taking: Reported on 12/25/2023)   Not Taking   insulin  NPH Human (NOVOLIN N) 100 UNIT/ML injection Inject 15 Units into the skin daily before breakfast.      insulin  NPH Human (NOVOLIN N) 100 UNIT/ML injection Inject 25 Units into the skin at bedtime.      nitrofurantoin , macrocrystal-monohydrate, (MACROBID ) 100 MG capsule Take 1 capsule (100 mg total) by mouth 2 (two) times daily. 14 capsule 0    Prenatal Vit-Fe Fumarate-FA (MULTIVITAMIN-PRENATAL) 27-0.8 MG TABS tablet Take 1 tablet by mouth daily at 12 noon.       I have reviewed patient's Past Medical Hx, Surgical Hx, Family Hx, Social Hx, medications and allergies.   ROS:  Review of Systems  Gastrointestinal:  Positive for abdominal pain.   Other systems negative  Physical Exam  Patient Vitals for the past 24 hrs:  BP Temp Pulse Resp SpO2 Height Weight  12/25/23 0522 (!) 148/94 -- (!) 116 -- -- -- --  12/25/23 0506 (!) 158/85 -- -- -- -- -- --  12/25/23 0504 -- 97.9 F (36.6 C) (!) 107  18 100 % 5' 4 (1.626 m) 101.8 kg   Vitals:   12/25/23 0506 12/25/23 0522 12/25/23 0531 12/25/23 0546  BP: (!) 158/85 (!) 148/94 (!) 143/85 138/86   12/25/23 0601 12/25/23 0700  BP: (!) 142/89 132/61   Did have  elevated BP in June 6/25 & 6/27 MRBP's  Constitutional: Well-developed, well-nourished female in no acute distress.  Cardiovascular: normal rate and rhythm Respiratory: normal effort, clear to auscultation bilaterally GI: Abd soft, non-tender, gravid appropriate for gestational age.   No rebound or guarding. MS: Extremities nontender, 1+ pedal edema, normal ROM Neurologic: Alert and oriented x 4.  GU: Neg CVAT.  PELVIC EXAM:  Dilation: 1.5 Effacement (%): 90 Cervical Position: Posterior Station:  -1 Presentation: Vertex Exam by:: Williams CNM    FHT:  Baseline 140 , moderate variability, accelerations present, no decelerations Contractions: q 7 mins Irregular    Labs: Results for orders placed or performed during the hospital encounter of 12/25/23 (from the past 24 hours)  Urinalysis, Routine w reflex microscopic -Urine, Clean Catch     Status: Abnormal   Collection Time: 12/25/23  5:29 AM  Result Value Ref Range   Color, Urine YELLOW YELLOW   APPearance HAZY (A) CLEAR   Specific Gravity, Urine 1.010 1.005 - 1.030   pH 6.0 5.0 - 8.0   Glucose, UA NEGATIVE NEGATIVE mg/dL   Hgb urine dipstick NEGATIVE NEGATIVE   Bilirubin Urine NEGATIVE NEGATIVE   Ketones, ur NEGATIVE NEGATIVE mg/dL   Protein, ur NEGATIVE NEGATIVE mg/dL   Nitrite NEGATIVE NEGATIVE   Leukocytes,Ua NEGATIVE NEGATIVE  Protein / creatinine ratio, urine     Status: None   Collection Time: 12/25/23  5:29 AM  Result Value Ref Range   Creatinine, Urine 72 mg/dL   Total Protein, Urine 11 mg/dL   Protein Creatinine Ratio 0.15 0.00 - 0.15 mg/mg[Cre]  CBC     Status: None   Collection Time: 12/25/23  6:26 AM  Result Value Ref Range   WBC 7.9 4.0 - 10.5 K/uL   RBC 4.49 3.87 - 5.11 MIL/uL   Hemoglobin 13.3 12.0 - 15.0 g/dL   HCT 59.5 63.9 - 53.9 %   MCV 90.0 80.0 - 100.0 fL   MCH 29.6 26.0 - 34.0 pg   MCHC 32.9 30.0 - 36.0 g/dL   RDW 86.1 88.4 - 84.4 %   Platelets 249 150 - 400 K/uL   nRBC 0.0 0.0 - 0.2 %  Comprehensive metabolic panel     Status: Abnormal   Collection Time: 12/25/23  6:26 AM  Result Value Ref Range   Sodium 136 135 - 145 mmol/L   Potassium 3.9 3.5 - 5.1 mmol/L   Chloride 103 98 - 111 mmol/L   CO2 19 (L) 22 - 32 mmol/L   Glucose, Bld 99 70 - 99 mg/dL   BUN 12 6 - 20 mg/dL   Creatinine, Ser 9.23 0.44 - 1.00 mg/dL   Calcium  9.9 8.9 - 10.3 mg/dL   Total Protein 6.4 (L) 6.5 - 8.1 g/dL   Albumin 2.7 (L) 3.5 - 5.0 g/dL   AST 17 15 - 41 U/L   ALT 18 0 - 44 U/L   Alkaline Phosphatase 109 38  - 126 U/L   Total Bilirubin 0.5 0.0 - 1.2 mg/dL   GFR, Estimated >39 >39 mL/min   Anion gap 14 5 - 15    --/--/O POS (06/25 1858)  Imaging:    MAU Course/MDM: I have reviewed the triage vital signs  and the nursing notes.   Pertinent labs & imaging results that were available during my care of the patient were reviewed by me and considered in my medical decision making (see chart for details).      I have reviewed her medical records including past results, notes and treatments.   I have ordered labs and reviewed results.  NST reviewed Consult Dr Alger with presentation, exam findings and test results.  Treatments in MAU included EFM.    Assessment: Single IUP at [redacted]w[redacted]d Gestational Hypertension  Plan: Care handed over to oncoming provider.  Earnie Pouch CNM, MSN Certified Nurse-Midwife 12/25/2023 5:49 AM ------------------------------------------------------------------------------------------------- Discussed with Dr Abigail [facility] Attending) Patient meets criteria for gHTN and therefore IOL is recommended at this time.  Dr Okey Berstein [facility] Private attending is aware of the recommendation and will place admission orders to LD for IOL , Patient is agreeable with this plan  Olam Dalton, MSN, [facility], Center for [facility]

## 2023-12-25 NOTE — Progress Notes (Signed)
 Ctxs not showing well on EFM due to pt being on her R side

## 2023-12-25 NOTE — MAU Note (Addendum)
 Alexandra Ayala is a 28 y.o. at [redacted]w[redacted]d here in MAU reporting abdominal cramping since yesterday which has gotten worse. Loose stools for 3 days. Some pelvic pain which is worse with walking. States Fm has been alittle less in the last day.  Denies VB or LOF.   LMP: na Onset of complaint: Tuesday Pain score: 7 Vitals:   12/25/23 0504 12/25/23 0506  BP:  (!) 158/85  Pulse: (!) 107   Resp: 18   Temp: 97.9 F (36.6 C)   SpO2: 100%      FHT: pt has on one piece outfit. Will listen once in her room  Lab orders placed from triage: u/a

## 2023-12-25 NOTE — H&P (Signed)
 Alexandra Ayala is a 28 y.o. female presenting for contractions  28 year old G1 P0 at 38+1 presents for evaluation of labor.  Patient was ruled out for labor however her blood pressures were elevated in the mild range.  She met criteria for gestational hypertension and is being admitted for induction.  Of note her pregnancy has been complicated by gestational diabetes A2.  She was admitted for glucose regulation around 35 weeks for implementation of insulin  due to a combination of difficulty obtaining her appropriate medications and confusion regarding appropriate insulin  regimen.  Ultrasound for EFW on 6/26 at the time of her admission at 35 weeks was EFW 94% (7lb), AC 98%, AFI 15.5cm, cephalic. Her current insulin  regimen is 15 units of NPH in the morning and 24 units at bedtime.  She was continuing to require increasing doses of insulin  for adequate control.  She was offered induction of labor for an adequate glucose control yesterday however declined because she felt she was not ready.   Pregnancy problems:  1) Maternal obesity: Initial BMI 37 2) A2 diabetes mellitus: Ultrasound for EFW on 6/26 at the time of her admission at 35 weeks was EFW 94% (7lb), AC 98%, AFI 15.5cm, cephalic. Her current insulin  regimen is 15 units of NPH in the morning and 24 units at bedtime 3) asthma: No hospitalizations or intubations.  Rescue inhaler as needed 4) genital herpes: Valtrex  suppression, no recent outbreaks OB History     Gravida  1   Para  0   Term  0   Preterm  0   AB  0   Living  0      SAB  0   IAB  0   Ectopic  0   Multiple  0   Live Births  0          Past Medical History:  Diagnosis Date   Asthma    has inhaler   Environmental and seasonal allergies    Gestational diabetes    Past Surgical History:  Procedure Laterality Date   NO PAST SURGERIES     Family History: family history is not on file. Social History:  reports that she has never smoked. She has never  used smokeless tobacco. She reports that she does not currently use alcohol. She reports that she does not currently use drugs after having used the following drugs: Marijuana.     Maternal Diabetes: Yes:  Diabetes Type:  Insulin /Medication controlled Genetic Screening: Normal Maternal Ultrasounds/Referrals: Normal Fetal Ultrasounds or other Referrals:  Referred to Materal Fetal Medicine  Maternal Substance Abuse:  No Significant Maternal Medications:  Meds include: Other:  Significant Maternal Lab Results:  None Number of Prenatal Visits:greater than 3 verified prenatal visits Maternal Vaccinations:TDap Other Comments:  None  Review of Systems History Dilation: 1.5 Effacement (%): 90 Station: -1 Exam by:: Williams CNM Blood pressure 136/66, pulse 95, temperature 97.9 F (36.6 C), resp. rate 18, height 5' 4 (1.626 m), weight 101.8 kg, SpO2 100%. Exam Physical Exam  Alert and oriented x 3, no apparent distress Gravid, soft Fetal heart rate 135, reactive, category 1 tracing Prenatal labs: ABO, Rh: --/--/O POS (06/25 1858) Antibody: NEG (06/25 1858) Rubella: Immune (01/07 0000) RPR: Nonreactive (01/07 0000)  HBsAg: Negative (01/07 0000)  HIV: Non-reactive (01/07 0000)  GBS: Negative/-- (07/02 0000)  Results for orders placed or performed during the hospital encounter of 12/25/23 (from the past 24 hours)  Urinalysis, Routine w reflex microscopic -Urine, Clean Catch  Status: Abnormal   Collection Time: 12/25/23  5:29 AM  Result Value Ref Range   Color, Urine YELLOW YELLOW   APPearance HAZY (A) CLEAR   Specific Gravity, Urine 1.010 1.005 - 1.030   pH 6.0 5.0 - 8.0   Glucose, UA NEGATIVE NEGATIVE mg/dL   Hgb urine dipstick NEGATIVE NEGATIVE   Bilirubin Urine NEGATIVE NEGATIVE   Ketones, ur NEGATIVE NEGATIVE mg/dL   Protein, ur NEGATIVE NEGATIVE mg/dL   Nitrite NEGATIVE NEGATIVE   Leukocytes,Ua NEGATIVE NEGATIVE  Protein / creatinine ratio, urine     Status: None    Collection Time: 12/25/23  5:29 AM  Result Value Ref Range   Creatinine, Urine 72 mg/dL   Total Protein, Urine 11 mg/dL   Protein Creatinine Ratio 0.15 0.00 - 0.15 mg/mg[Cre]  CBC     Status: None   Collection Time: 12/25/23  6:26 AM  Result Value Ref Range   WBC 7.9 4.0 - 10.5 K/uL   RBC 4.49 3.87 - 5.11 MIL/uL   Hemoglobin 13.3 12.0 - 15.0 g/dL   HCT 59.5 63.9 - 53.9 %   MCV 90.0 80.0 - 100.0 fL   MCH 29.6 26.0 - 34.0 pg   MCHC 32.9 30.0 - 36.0 g/dL   RDW 86.1 88.4 - 84.4 %   Platelets 249 150 - 400 K/uL   nRBC 0.0 0.0 - 0.2 %  Comprehensive metabolic panel     Status: Abnormal   Collection Time: 12/25/23  6:26 AM  Result Value Ref Range   Sodium 136 135 - 145 mmol/L   Potassium 3.9 3.5 - 5.1 mmol/L   Chloride 103 98 - 111 mmol/L   CO2 19 (L) 22 - 32 mmol/L   Glucose, Bld 99 70 - 99 mg/dL   BUN 12 6 - 20 mg/dL   Creatinine, Ser 9.23 0.44 - 1.00 mg/dL   Calcium  9.9 8.9 - 10.3 mg/dL   Total Protein 6.4 (L) 6.5 - 8.1 g/dL   Albumin 2.7 (L) 3.5 - 5.0 g/dL   AST 17 15 - 41 U/L   ALT 18 0 - 44 U/L   Alkaline Phosphatase 109 38 - 126 U/L   Total Bilirubin 0.5 0.0 - 1.2 mg/dL   GFR, Estimated >39 >39 mL/min   Anion gap 14 5 - 15  Glucose, capillary     Status: Abnormal   Collection Time: 12/25/23  9:30 AM  Result Value Ref Range   Glucose-Capillary 112 (H) 70 - 99 mg/dL     Assessment/Plan: 1) admit 2) induction of labor.  Will attempt placement of Foley catheter for induction of labor. 3) epidural on request 4) CBGs every 4 hours while latent latent labor, every hour while in active labor    Marjorie Gull 12/25/2023, 10:16 AM

## 2023-12-25 NOTE — Progress Notes (Signed)
 Pt on R side.

## 2023-12-25 NOTE — MAU Note (Signed)
 Pt given clicker to mark FM

## 2023-12-25 NOTE — Anesthesia Preprocedure Evaluation (Addendum)
 Anesthesia Evaluation  Patient identified by MRN, date of birth, ID band Patient awake    Reviewed: Allergy & Precautions, Patient's Chart, lab work & pertinent test results  Airway Mallampati: II       Dental no notable dental hx.    Pulmonary asthma    Pulmonary exam normal        Cardiovascular hypertension, Normal cardiovascular exam     Neuro/Psych negative neurological ROS     GI/Hepatic   Endo/Other  diabetes, Gestational, Insulin  Dependent    Renal/GU      Musculoskeletal   Abdominal   Peds  Hematology   Anesthesia Other Findings   Reproductive/Obstetrics (+) Pregnancy                              Anesthesia Physical Anesthesia Plan  ASA: 2  Anesthesia Plan: Epidural   Post-op Pain Management:    Induction:   PONV Risk Score and Plan: 0  Airway Management Planned: Natural Airway  Additional Equipment: None  Intra-op Plan:   Post-operative Plan:   Informed Consent: I have reviewed the patients History and Physical, chart, labs and discussed the procedure including the risks, benefits and alternatives for the proposed anesthesia with the patient or authorized representative who has indicated his/her understanding and acceptance.       Plan Discussed with:   Anesthesia Plan Comments: (Lab Results      Component                Value               Date                      WBC                      7.9                 12/25/2023                HGB                      13.3                12/25/2023                HCT                      40.4                12/25/2023                MCV                      90.0                12/25/2023                PLT                      249                 12/25/2023           )         Anesthesia Quick Evaluation

## 2023-12-26 ENCOUNTER — Encounter (HOSPITAL_COMMUNITY): Payer: Self-pay | Admitting: Obstetrics and Gynecology

## 2023-12-26 LAB — GLUCOSE, CAPILLARY: Glucose-Capillary: 84 mg/dL (ref 70–99)

## 2023-12-26 LAB — RPR: RPR Ser Ql: NONREACTIVE

## 2023-12-26 MED ORDER — OXYCODONE HCL 5 MG PO TABS: 10.0000 mg | ORAL_TABLET | ORAL | Status: DC | PRN

## 2023-12-26 MED ORDER — SENNOSIDES-DOCUSATE SODIUM 8.6-50 MG PO TABS
2.0000 | ORAL_TABLET | Freq: Every day | ORAL | Status: DC
  Administered 2023-12-27 – 2023-12-28 (×2): 2 via ORAL
  Filled 2023-12-26 (×2): qty 2

## 2023-12-26 MED ORDER — LACTATED RINGERS IV SOLN: INTRAVENOUS | Status: AC

## 2023-12-26 MED ORDER — DIPHENHYDRAMINE HCL 25 MG PO CAPS: 25.0000 mg | ORAL_CAPSULE | Freq: Four times a day (QID) | ORAL | Status: DC | PRN

## 2023-12-26 MED ORDER — ONDANSETRON HCL 4 MG/2ML IJ SOLN: 4.0000 mg | INTRAMUSCULAR | Status: DC | PRN

## 2023-12-26 MED ORDER — IBUPROFEN 600 MG PO TABS
600.0000 mg | ORAL_TABLET | Freq: Four times a day (QID) | ORAL | Status: DC
  Administered 2023-12-26 – 2023-12-28 (×9): 600 mg via ORAL
  Filled 2023-12-26 (×10): qty 1

## 2023-12-26 MED ORDER — SIMETHICONE 80 MG PO CHEW: 80.0000 mg | CHEWABLE_TABLET | ORAL | Status: DC | PRN

## 2023-12-26 MED ORDER — DIBUCAINE (PERIANAL) 1 % EX OINT: 1.0000 | TOPICAL_OINTMENT | CUTANEOUS | Status: DC | PRN

## 2023-12-26 MED ORDER — ONDANSETRON HCL 4 MG PO TABS: 4.0000 mg | ORAL_TABLET | ORAL | Status: DC | PRN

## 2023-12-26 MED ORDER — ACETAMINOPHEN 325 MG PO TABS: 650.0000 mg | ORAL_TABLET | ORAL | Status: DC | PRN

## 2023-12-26 MED ORDER — BENZOCAINE-MENTHOL 20-0.5 % EX AERO
1.0000 | INHALATION_SPRAY | CUTANEOUS | Status: DC | PRN
  Administered 2023-12-26 (×2): 1 via TOPICAL
  Filled 2023-12-26 (×2): qty 56

## 2023-12-26 MED ORDER — WITCH HAZEL-GLYCERIN EX PADS: 1.0000 | MEDICATED_PAD | CUTANEOUS | Status: DC | PRN

## 2023-12-26 MED ORDER — TETANUS-DIPHTH-ACELL PERTUSSIS 5-2.5-18.5 LF-MCG/0.5 IM SUSY: 0.5000 mL | PREFILLED_SYRINGE | Freq: Once | INTRAMUSCULAR | Status: DC

## 2023-12-26 MED ORDER — COCONUT OIL OIL
1.0000 | TOPICAL_OIL | Status: DC | PRN
  Administered 2023-12-26: 1 via TOPICAL

## 2023-12-26 MED ORDER — ZOLPIDEM TARTRATE 5 MG PO TABS
5.0000 mg | ORAL_TABLET | Freq: Every evening | ORAL | Status: DC | PRN
Start: 2023-12-26 — End: 2023-12-28

## 2023-12-26 MED ORDER — OXYCODONE HCL 5 MG PO TABS
5.0000 mg | ORAL_TABLET | ORAL | Status: DC | PRN
  Administered 2023-12-26 – 2023-12-27 (×2): 5 mg via ORAL
  Filled 2023-12-26 (×2): qty 1

## 2023-12-26 MED ORDER — PRENATAL MULTIVITAMIN CH
1.0000 | ORAL_TABLET | Freq: Every day | ORAL | Status: DC
  Administered 2023-12-26 – 2023-12-28 (×3): 1 via ORAL
  Filled 2023-12-26 (×3): qty 1

## 2023-12-26 MED ORDER — VALACYCLOVIR HCL 500 MG PO TABS
500.0000 mg | ORAL_TABLET | Freq: Two times a day (BID) | ORAL | Status: DC
  Administered 2023-12-26 – 2023-12-28 (×4): 500 mg via ORAL
  Filled 2023-12-26 (×4): qty 1

## 2023-12-26 NOTE — Lactation Note (Signed)
 This note was copied from a baby's chart. Lactation Consultation Note  Patient Name: Alexandra Ayala Date: 12/26/2023 Age:27 hours  Attempted to see mom. A light was on, support person not in rm. Mom is sleeping all ready.   Maternal Data    Feeding    LATCH Score Latch: Grasps breast easily, tongue down, lips flanged, rhythmical sucking.  Audible Swallowing: Spontaneous and intermittent  Type of Nipple: Everted at rest and after stimulation  Comfort (Breast/Nipple): Soft / non-tender  Hold (Positioning): Assistance needed to correctly position infant at breast and maintain latch.  LATCH Score: 9   Lactation Tools Discussed/Used    Interventions    Discharge    Consult Status      Tahiry Spicer G 12/26/2023, 4:54 AM

## 2023-12-26 NOTE — Anesthesia Postprocedure Evaluation (Signed)
 Anesthesia Post Note  Patient: Alexandra Ayala  Procedure(s) Performed: AN AD HOC LABOR EPIDURAL     Patient location during evaluation: Mother Baby Anesthesia Type: Epidural Level of consciousness: awake, oriented and awake and alert Pain management: pain level controlled Vital Signs Assessment: post-procedure vital signs reviewed and stable Respiratory status: spontaneous breathing, nonlabored ventilation and respiratory function stable Cardiovascular status: stable Postop Assessment: no headache, patient able to bend at knees, adequate PO intake, able to ambulate and no apparent nausea or vomiting Anesthetic complications: no   No notable events documented.  Last Vitals:  Vitals:   12/26/23 0430 12/26/23 0535  BP: (!) 149/89 119/76  Pulse: (!) 117 (!) 108  Resp: 20 20  Temp: 36.8 C 36.7 C  SpO2: 100% 100%    Last Pain:  Vitals:   12/26/23 0536  TempSrc:   PainSc: 3    Pain Goal: Patients Stated Pain Goal: 0 (12/25/23 1059)                 Ritchie Klee

## 2023-12-26 NOTE — Progress Notes (Signed)
 Post Partum Day 0 Subjective: up ad lib, voiding, tolerating PO, + flatus, and lochia moderate; just changed pad. Pt reports meds helping with pain relief. She is bonding well with baby. Denies HA, CP, SOB. Bonding well with baby   Objective: Blood pressure 111/81, pulse 98, temperature 98.2 F (36.8 C), temperature source Oral, resp. rate 18, height 5' 4 (1.626 m), weight 101.8 kg, SpO2 100%, unknown if currently breastfeeding.  Physical Exam:  General: alert, cooperative, and no distress Lochia: appropriate Uterine Fundus: firm Incision: n/a DVT Evaluation: No evidence of DVT seen on physical exam. Calf/Ankle edema is present.  Recent Labs    12/25/23 0626 12/25/23 1342  HGB 13.3 13.5  HCT 40.4 40.5    Assessment/Plan: Breastfeeding Routine pp care    LOS: 1 day   Katina Remick W Cornelia Walraven, DO 12/26/2023, 12:25 PM

## 2023-12-26 NOTE — Lactation Note (Signed)
 This note was copied from a baby's chart. Lactation Consultation Note  Patient Name: Alexandra Ayala Unijb'd Date: 12/26/2023 Age:27 hours  Reason for consult: Primapara;1st time breastfeeding;Initial assessment;Maternal endocrine disorder;Early term 17-38.6wks;Breastfeeding assistance  P1, [redacted]w[redacted]d, GDM  Initial LC visit to see mother and baby Alexandra Paediatric nurse. Mother had baby in football hold. She states baby latched well after delivery. Basic breastfeeding education, pillows adjusted to support baby's alignment to the breast and mother taught hand expression. Baby was alert and calm. Mother was bale to express colostrum and entice baby to latch. Infant latched well, rhythmic sucking pattern with intermittent swallows. Mother states she was having uterine cramping during the feeding. Discussed how the hormones with breastfeeding also cause the uterus to cramp and keep the uterus firm.  Infant had just his second blood sugar drawn prior to my arrival. Mother encouraged to latch baby with feeding cues, place baby skin to skin if not latching, and call for assistance with breastfeeding as needed.   Mom made aware of O/P services, breastfeeding support groups, community resources, and our phone # for post-discharge questions.     Maternal Data Has patient been taught Hand Expression?: Yes Does the patient have breastfeeding experience prior to this delivery?: No  Feeding Mother's Current Feeding Choice: Breast Milk  LATCH Score Latch: Grasps breast easily, tongue down, lips flanged, rhythmical sucking.  Audible Swallowing: A few with stimulation  Type of Nipple: Everted at rest and after stimulation  Comfort (Breast/Nipple): Soft / non-tender  Hold (Positioning): Assistance needed to correctly position infant at breast and maintain latch.  LATCH Score: 8      Interventions Interventions: Breast feeding basics reviewed;Assisted with latch;Hand express;Breast compression;Adjust  position;Support pillows;Education;LC Services brochure;CDC milk storage guidelines;CDC Guidelines for Breast Pump Cleaning  Discharge Pump: Personal;DEBP  Consult Status Consult Status: Follow-up Date: 12/27/23 Follow-up type: In-patient    [name] 12/26/2023, 9:15 AM

## 2023-12-27 LAB — CBC
HCT: 31.1 % — ABNORMAL LOW (ref 36.0–46.0)
Hemoglobin: 10.3 g/dL — ABNORMAL LOW (ref 12.0–15.0)
MCH: 30.7 pg (ref 26.0–34.0)
MCHC: 33.1 g/dL (ref 30.0–36.0)
MCV: 92.6 fL (ref 80.0–100.0)
Platelets: 232 K/uL (ref 150–400)
RBC: 3.36 MIL/uL — ABNORMAL LOW (ref 3.87–5.11)
RDW: 14.3 % (ref 11.5–15.5)
WBC: 12.8 K/uL — ABNORMAL HIGH (ref 4.0–10.5)
nRBC: 0 % (ref 0.0–0.2)

## 2023-12-27 LAB — GLUCOSE, CAPILLARY: Glucose-Capillary: 111 mg/dL — ABNORMAL HIGH (ref 70–99)

## 2023-12-27 NOTE — Progress Notes (Signed)
 Patient is doing well.  She is ambulating, voiding, tolerating PO.  Pain control is good.  Lochia is appropriate  Vitals:   12/26/23 1029 12/26/23 1428 12/26/23 2121 12/27/23 0544  BP: 111/81 (!) 129/56 (!) 101/57 (!) 117/56  Pulse: 98 98 (!) 108 100  Resp: 18 16 18 20   Temp: 98.2 F (36.8 C) 98.5 F (36.9 C) 98.1 F (36.7 C) 97.8 F (36.6 C)  TempSrc: Oral Oral Oral Oral  SpO2:  98% 96% 98%  Weight:      Height:        NAD Fundus firm Ext: trace pedal edema  Lab Results  Component Value Date   WBC 12.8 (H) 12/27/2023   HGB 10.3 (L) 12/27/2023   HCT 31.1 (L) 12/27/2023   MCV 92.6 12/27/2023   PLT 232 12/27/2023    --/--/O POS (07/16 9372)  A/P 28 y.o. G1P1001 PPD#1 s/p SVD at 38 weeks for GHTN, poorly controlled GDM. Routine postpartum care.   GHTN: BPs mild range since delivery, will continue to follow GDMA2: fasting BP this AM 111 (but obtained at 930) will repeat again tomorrow Desires circumcision: per infant H&P,  ? Mild hypospadias.  Patient reports she was told would receive a referral for pediatric urology.   Expect d/c tomorrow.    [facility] GEFFEL [employer]

## 2023-12-27 NOTE — Progress Notes (Signed)
 CSW received consult due to score 11 on Edinburgh Depression Screen.  CSW met with MOB at bedside to complete assessment, FOB present. CSW introduced self and explained role. MOB granted CSW verbal permission to speak in front of FOB about anything. CSW explained reason for consult. MOB was welcoming, pleasant, and remained engaged during assessment. CSW and MOB discussed elevated edinburgh score 11. MOB acknowledged stress stemming from keeping up with gestational diabetes. CSW acknowledged and normalized how this health complication can cause some stress. MOB denied any mental health history. CSW inquired about how MOB was feeling emotionally since giving birth, MOB reported that she was feeling fine and just in pain. CSW asked if MOB received pain medication, MOB reported yes and explained that she will get additional medication later. MOB presented calm and did not demonstrate any acute mental health signs/symptoms. CSW assessed for safety, MOB denied SI and HI. CSW inquired about MOB's support system, MOB reported that she has a good support system.   CSW provided education regarding Baby Blues vs PMADs and provided MOB with resources for mental health follow up.  CSW encouraged MOB to evaluate her mental health throughout the postpartum period with the use of the New Mom Checklist developed by Postpartum Progress as well as the [place] Postnatal Depression Scale and notify a medical professional if symptoms arise.    CSW provided review of Sudden Infant Death Syndrome (SIDS) precautions. Parents verbalized understanding and reported having all items needed to care for infant including a car seat and crib.  CSW inquired about any additional needs for supports/resources, MOB reported none.   CSW identifies no further need for intervention and no barriers to discharge at this time.  Suzen Law, LCSW Clinical Social Worker [facility] Cell#: [phone]

## 2023-12-28 ENCOUNTER — Other Ambulatory Visit: Payer: Self-pay

## 2023-12-28 LAB — GLUCOSE, CAPILLARY: Glucose-Capillary: 83 mg/dL (ref 70–99)

## 2023-12-28 MED ORDER — IBUPROFEN 600 MG PO TABS: 600.0000 mg | ORAL_TABLET | Freq: Four times a day (QID) | ORAL | Status: AC | PRN

## 2023-12-28 MED ORDER — ACETAMINOPHEN 325 MG PO TABS: 650.0000 mg | ORAL_TABLET | Freq: Four times a day (QID) | ORAL | Status: DC | PRN

## 2023-12-28 NOTE — Discharge Summary (Signed)
 Postpartum Discharge Summary  Date of Service updated 7/16-7/19/25     Patient Name: Alexandra Ayala DOB: 03-03-1996 MRN: [id number]  Date of admission: 12/25/2023 Delivery date:12/26/2023 Delivering provider: OKEY LEADER Date of discharge: 12/28/2023  Admitting diagnosis: Gestational hypertension [O13.9] Gestational hypertension, third trimester [O13.3] Spontaneous vaginal delivery [O80] Intrauterine pregnancy: [redacted]w[redacted]d     Secondary diagnosis:  gDMA2   Discharge diagnosis: Term Pregnancy Delivered, Gestational Hypertension, and GDM A2                                              Post partum procedures:none Augmentation: Cytotec and IP Foley Complications: None  Hospital course: Induction of Labor With Vaginal Delivery   28 y.o. yo G1P1001 at [redacted]w[redacted]d was admitted to the hospital 12/25/2023 for induction of labor.  Indication for induction: Gestational hypertension and A2 DM.  Patient had an labor course complicated by nothing. Membrane Rupture Time/Date: 12:10 PM,12/25/2023  Delivery Method:Vaginal, Spontaneous Episiotomy: None Lacerations:  2nd degree;Perineal Details of delivery can be found in separate delivery note.  Patient had a postpartum course complicated by nothing. Patient is discharged home 12/28/23.  Newborn Data: Birth date:12/26/2023 Birth time:1:51 AM Gender:Female Living status:Living Apgars:8 ,9  Weight:3580 g  Magnesium Sulfate received: No BMZ received: No Rhophylac:No MMR:No T-DaP:Given prenatally Flu: No RSV Vaccine received: No Transfusion:No Immunizations administered: There is no immunization history for the selected administration types on file for this patient.  Physical exam  Vitals:   12/26/23 2121 12/27/23 0544 12/27/23 2128 12/28/23 0433  BP: (!) 101/57 (!) 117/56 121/63 118/80  Pulse: (!) 108 100 86 82  Resp: 18 20 19 17   Temp: 98.1 F (36.7 C) 97.8 F (36.6 C) 98.4 F (36.9 C) 98.2 F (36.8 C)  TempSrc: Oral Oral Oral    SpO2: 96% 98% 99%   Weight:      Height:       General: alert, cooperative, and no distress Lochia: appropriate Uterine Fundus: firm DVT Evaluation: No evidence of DVT seen on physical exam. Labs: Lab Results  Component Value Date   WBC 12.8 (H) 12/27/2023   HGB 10.3 (L) 12/27/2023   HCT 31.1 (L) 12/27/2023   MCV 92.6 12/27/2023   PLT 232 12/27/2023      Latest Ref Rng & Units 12/25/2023    6:26 AM  CMP  Glucose 70 - 99 mg/dL 99   BUN 6 - 20 mg/dL 12   Creatinine 9.55 - 1.00 mg/dL 9.23   Sodium 864 - 854 mmol/L 136   Potassium 3.5 - 5.1 mmol/L 3.9   Chloride 98 - 111 mmol/L 103   CO2 22 - 32 mmol/L 19   Calcium  8.9 - 10.3 mg/dL 9.9   Total Protein 6.5 - 8.1 g/dL 6.4   Total Bilirubin 0.0 - 1.2 mg/dL 0.5   Alkaline Phos 38 - 126 U/L 109   AST 15 - 41 U/L 17   ALT 0 - 44 U/L 18    Edinburgh Score:    12/27/2023   10:50 AM  Edinburgh Postnatal Depression Scale Screening Tool  I have been able to laugh and see the funny side of things. 1  I have looked forward with enjoyment to things. 1  I have blamed myself unnecessarily when things went wrong. 1  I have been anxious or worried for no good reason. 2  I  have felt scared or panicky for no good reason. 2  Things have been getting on top of me. 1  I have been so unhappy that I have had difficulty sleeping. 2  I have felt sad or miserable. 1  I have been so unhappy that I have been crying. 0  The thought of harming myself has occurred to me. 0  Edinburgh Postnatal Depression Scale Total 11      After visit meds:  Allergies as of 12/28/2023   No Known Allergies      Medication List     STOP taking these medications    acyclovir 800 MG tablet Commonly known as: ZOVIRAX   aspirin  EC 81 MG tablet   cetirizine 10 MG tablet Commonly known as: ZYRTEC   insulin  NPH Human 100 UNIT/ML injection Commonly known as: NOVOLIN N       TAKE these medications    acetaminophen  325 MG tablet Commonly known as:  Tylenol  Take 2 tablets (650 mg total) by mouth every 6 (six) hours as needed (for pain scale < 4).   albuterol 108 (90 Base) MCG/ACT inhaler Commonly known as: VENTOLIN HFA Inhale 2 puffs into the lungs every 6 (six) hours as needed for wheezing or shortness of breath.   ibuprofen  600 MG tablet Commonly known as: ADVIL  Take 1 tablet (600 mg total) by mouth every 6 (six) hours as needed.   multivitamin-prenatal 27-0.8 MG Tabs tablet Take 1 tablet by mouth daily at 12 noon.         Discharge home in stable condition Infant Feeding: Bottle Infant Disposition:home with mother Discharge instruction: per After Visit Summary and Postpartum booklet. Activity: Advance as tolerated. Pelvic rest for 6 weeks.  Diet: carb modified diet Anticipated Birth Control: Unsure Postpartum Appointment:1 week Additional Postpartum F/U: 2 hour GTT and BP check 1 week Future Appointments:No future appointments. Follow up Visit:  Follow-up Information     Claire Rubie LABOR, MD. Schedule an appointment as soon as possible for a visit in 1 week(s).   Specialty: Obstetrics and Gynecology Contact information: [address] [place] [address] [phone]                     12/28/2023 Rubie LABOR Claire, MD

## 2023-12-28 NOTE — Lactation Note (Addendum)
 This note was copied from a baby's chart. Lactation Consultation Note  Patient Name: Alexandra Ayala Date: 12/28/2023 Age:27 hours Reason for consult: Follow-up assessment;Maternal discharge;Early term 37-38.6wks  P1, 38 wks, @ 55 hrs of life. Discharge anticipated today. Mom receptive to donor milk top off by bedside RN. Encouraged mom her/baby are exactly where they should be right now and she's doing wonderfully. Weight loss is within normal expectations. Encouraged mom to keep working on big mouth latch with baby and use EBM or coconut oil- coconut oil provided-  after each feed. Discussed cluster feeding overnight/ early morning brings in our milk supply, shared expectations of milk coming in. Highlighted risk of engorgement. Discussed hand pump/express to soften breasts, motrin  as anti-inflammatory, and ice packs for 10-20 minutes post feed/pumping if still over-full is the best treatments for inflamed/engorged breasts. Hand pump provided with flanges- highlighted dynamic sizing with breast changes.   Maternal Data Does the patient have breastfeeding experience prior to this delivery?: No  Feeding Mother's Current Feeding Choice: Breast Milk and Donor Milk   Lactation Tools Discussed/Used Tools: Pump;Flanges;Coconut oil Breast pump type: Manual Pump Education: Milk Storage;Setup, frequency, and cleaning  Interventions Interventions: Expressed milk;Hand pump;DEBP;Education;LC Services brochure;CDC milk storage guidelines  Discharge Discharge Education: Engorgement and breast care Pump: Manual;Personal;DEBP  Consult Status Consult Status: Complete Date: 12/28/23 Follow-up type: In-patient    [facility] 12/28/2023, 9:55 AM

## 2023-12-28 NOTE — Progress Notes (Addendum)
 Patient is doing well.  She is ambulating, voiding, tolerating PO.  Pain control is good.  Lochia is appropriate. Breastfeeding is going well, hoping for milk to come in soon, has been working with lactation.  Vitals:   12/26/23 2121 12/27/23 0544 12/27/23 2128 12/28/23 0433  BP: (!) 101/57 (!) 117/56 121/63 118/80  Pulse: (!) 108 100 86 82  Resp: 18 20 19 17   Temp: 98.1 F (36.7 C) 97.8 F (36.6 C) 98.4 F (36.9 C) 98.2 F (36.8 C)  TempSrc: Oral Oral Oral   SpO2: 96% 98% 99%   Weight:      Height:        NAD Fundus firm Ext: no evidence of DVT  Lab Results  Component Value Date   WBC 12.8 (H) 12/27/2023   HGB 10.3 (L) 12/27/2023   HCT 31.1 (L) 12/27/2023   MCV 92.6 12/27/2023   PLT 232 12/27/2023    --/--/O POS (07/16 9372)  A/P 28 y.o. G1P1001 PPD#2 s/p SVD at 38 weeks for GHTN, poorly controlled GDM. Routine postpartum care.   GHTN: Met criteria on admission. Normotensive for last 24 hours, no meds. Asx. Discussed pre-E precautions, patient plans to check Bps daily at home. Plan 1 week postpartum BP check. GDMA2: fasting BP this AM 83. Plan 6w postpartum 2hr GTT. Desires circumcision: per infant H&P,  ? Mild hypospadias.  Patient reports she was told would receive a referral for pediatric urology.    Dispo: Anticipate d/c today pending baby's dispo.    Alexandra Ayala

## 2023-12-29 ENCOUNTER — Inpatient Hospital Stay (HOSPITAL_COMMUNITY)

## 2023-12-29 ENCOUNTER — Inpatient Hospital Stay (HOSPITAL_COMMUNITY): Admission: RE | Admit: 2023-12-29 | Source: Home / Self Care | Admitting: Obstetrics

## 2023-12-29 ENCOUNTER — Inpatient Hospital Stay (HOSPITAL_COMMUNITY)
Admission: AD | Admit: 2023-12-29 | Discharge: 2023-12-30 | Disposition: A | Discharge status: Home / Self Care | Attending: Obstetrics and Gynecology | Admitting: Obstetrics and Gynecology

## 2023-12-29 DIAGNOSIS — S01512D Laceration without foreign body of oral cavity, subsequent encounter: Secondary | ICD-10-CM | POA: Diagnosis not present

## 2023-12-29 DIAGNOSIS — R102 Pelvic and perineal pain: Secondary | ICD-10-CM | POA: Insufficient documentation

## 2023-12-29 DIAGNOSIS — O99893 Other specified diseases and conditions complicating puerperium: Secondary | ICD-10-CM | POA: Diagnosis not present

## 2023-12-29 MED ORDER — LIDOCAINE HCL URETHRAL/MUCOSAL 2 % EX GEL
1.0000 | Freq: Once | CUTANEOUS | Status: DC
Start: 2023-12-29 — End: 2023-12-30
  Filled 2023-12-29: qty 6

## 2023-12-29 NOTE — MAU Note (Signed)
 Alexandra Ayala is a 28 y.o. at Unknown here in MAU reporting: vag delivery 12/26/23 vag delivery. Had a repair. Pt reports extreme pain to vagina and stiches are coming out and area is bleeding. Cannot sit down due to discomfort.   LMP:  Onset of complaint: yesterday Pain score: 8  Vitals:   12/29/23 2217  BP: 131/80  Pulse: (!) 110  Resp: 18  Temp: 98.6 F (37 C)     FHT: n/a   Lab orders placed from triage:

## 2023-12-29 NOTE — MAU Provider Note (Signed)
 History     CSN: [id number]  Arrival date and time: 12/29/23 2058   Event Date/Time   First Provider Initiated Contact with Patient 12/29/23 2310      Chief Complaint  Patient presents with   Wound Check   Waddell Burnetta Mask , a  28 y.o. G1P1001 at Unknown presents to MAU with complaints of her tear from delivery opening up. Patient is 3 days PP and states that once she got home she noticed increased pain, and bleeding. She reports that she is having burning with urination and increased pain. She reports taking ibuprofen  early this morning but reports that it was not helping. She also reports using Ice pads and tucks without relief.          OB History     Gravida  1   Para  1   Term  1   Preterm  0   AB  0   Living  1      SAB  0   IAB  0   Ectopic  0   Multiple  0   Live Births  1           Past Medical History:  Diagnosis Date   Asthma    has inhaler   Environmental and seasonal allergies    Gestational diabetes     Past Surgical History:  Procedure Laterality Date   NO PAST SURGERIES      No family history on file.  Social History   Tobacco Use   Smoking status: Never   Smokeless tobacco: Never  Vaping Use   Vaping status: Never Used  Substance Use Topics   Alcohol use: Not Currently    Comment: occasional, last on 8-18   Drug use: Not Currently    Types: Marijuana    Comment: last used 01-28-18    Allergies: No Known Allergies  No medications prior to admission.    Review of Systems  Constitutional:  Negative for chills, fatigue and fever.  Eyes:  Negative for pain and visual disturbance.  Respiratory:  Negative for apnea, shortness of breath and wheezing.   Cardiovascular:  Negative for chest pain and palpitations.  Gastrointestinal:  Negative for abdominal pain, constipation, diarrhea, nausea and vomiting.  Genitourinary:  Positive for vaginal bleeding and vaginal pain. Negative for difficulty urinating, dysuria,  pelvic pain and vaginal discharge.  Musculoskeletal:  Negative for back pain.  Neurological:  Negative for seizures, weakness and headaches.  Psychiatric/Behavioral:  Negative for suicidal ideas.    Physical Exam   Blood pressure 131/80, pulse (!) 110, temperature 98.6 F (37 C), resp. rate 18, height 5' 4 (1.626 m), weight 98 kg, currently breastfeeding.  Physical Exam Vitals and nursing note reviewed. Exam conducted with a chaperone present.  Constitutional:      General: She is not in acute distress.    Appearance: Normal appearance.  HENT:     Head: Normocephalic.  Pulmonary:     Effort: Pulmonary effort is normal.  Genitourinary:     Comments: There is a 1cm tear in the right labia minora.  Musculoskeletal:     Cervical back: Normal range of motion.  Skin:    General: Skin is warm and dry.  Neurological:     Mental Status: She is alert and oriented to person, place, and time.  Psychiatric:        Mood and Affect: Mood normal.         MAU Course  Procedures  MDM - Consult to Dr. Herchel on patient tear. Per MD needs to be put back together.  - Patient prefers for her on-call Dr. To complete repair. Call placed to Dr. Claire.  - Orders placed for lidocaine .  - Dr. Marvell unavailable at a this time.  - CNM and Faculty MD offered to complete repair however patient desires to allow to heal on its own.  - plan for discharge  Assessment and Plan   1. Laceration of labial mucosa without complication, subsequent encounter   2. Postpartum care and examination    - Reviewed that healing will occur with time - Rx for lidocaine  jelly sent with patient . - Discussed comfort measures that may help with discomfort.  - Patient has an appt scheduled for this week. Encouraged to keep appt time.  - Reviewed worsening signs and return precautions.  - patient discharged home in stable condition and may return to MAU as needed .  Claris CHRISTELLA Cedar, MSN CNM  12/30/2023, 1:58  AM

## 2023-12-30 DIAGNOSIS — S01512D Laceration without foreign body of oral cavity, subsequent encounter: Secondary | ICD-10-CM

## 2023-12-30 MED ORDER — LIDOCAINE HCL (PF) 1 % IJ SOLN
2.0000 mL | Freq: Once | INTRAMUSCULAR | Status: DC
  Filled 2023-12-30: qty 5

## 2023-12-30 MED ORDER — LIDOCAINE HCL URETHRAL/MUCOSAL 2 % EX GEL: 1.0000 | CUTANEOUS | 2 refills | Status: DC | PRN

## 2023-12-31 ENCOUNTER — Inpatient Hospital Stay (HOSPITAL_COMMUNITY)

## 2024-01-14 ENCOUNTER — Telehealth (HOSPITAL_COMMUNITY): Payer: Self-pay

## 2024-01-14 NOTE — Telephone Encounter (Signed)
 01/14/2024 1636  Name: Alexandra Ayala MRN: [id number] DOB: 15-Jul-1995  Reason for Call:  Transition of Care Hospital Discharge Call  Contact Status: Patient Contact Status: Complete  Language assistant needed:          Follow-Up Questions: Do You Have Any Concerns About Your Health As You Heal From Delivery?: No Do You Have Any Concerns About Your Infants Health?: No  Edinburgh Postnatal Depression Scale:  In the Past 7 Days:    PHQ2-9 Depression Scale:     Discharge Follow-up: Edinburgh score requires follow up?:  (Patient would like a call back tomorrow to complete EPDS. Patient states that it is not currently a good time for her to do screening.) Patient was advised of the following resources:: Breastfeeding Support Group, Support Group  Post-discharge interventions: Reviewed Newborn Safe Sleep Practices  Signature  Rosaline Deretha PEAK

## 2024-01-15 ENCOUNTER — Telehealth (HOSPITAL_COMMUNITY): Payer: Self-pay | Admitting: *Deleted

## 2024-01-15 NOTE — Telephone Encounter (Signed)
 01/15/2024  Name: Alexandra Ayala MRN: [id number] DOB: 05-04-1996  Reason for Call:  Transition of Care Hospital Discharge Call  Contact Status: Patient Contact Status: Complete  Language assistant needed: Interpreter Mode: Interpreter Not Needed        Follow-Up Questions:    Edinburgh Postnatal Depression Scale:  In the Past 7 Days: I have been able to laugh and see the funny side of things.: Not quite so much now I have looked forward with enjoyment to things.: Rather less than I used to I have blamed myself unnecessarily when things went wrong.: Not very often I have been anxious or worried for no good reason.: Hardly ever I have felt scared or panicky for no good reason.: No, not at all Things have been getting on top of me.: No, most of the time I have coped quite well I have been so unhappy that I have had difficulty sleeping.: Not at all I have felt sad or miserable.: No, not at all I have been so unhappy that I have been crying.: No, never The thought of harming myself has occurred to me.: Never [place] Postnatal Depression Scale Total: 5  PHQ2-9 Depression Scale:     Discharge Follow-up: Edinburgh score requires follow up?: No Patient was advised of the following resources::  (patient confirmed receiving email with resources from EMERSON Sprinkles, RN)  Post-discharge interventions: NA  Mliss Sieve, RN 01/15/2024 13:48

## 2024-04-22 ENCOUNTER — Ambulatory Visit (INDEPENDENT_AMBULATORY_CARE_PROVIDER_SITE_OTHER): Admitting: Podiatry

## 2024-04-22 ENCOUNTER — Ambulatory Visit

## 2024-04-22 DIAGNOSIS — M7662 Achilles tendinitis, left leg: Secondary | ICD-10-CM | POA: Diagnosis not present

## 2024-04-22 DIAGNOSIS — M722 Plantar fascial fibromatosis: Secondary | ICD-10-CM

## 2024-04-22 DIAGNOSIS — M7661 Achilles tendinitis, right leg: Secondary | ICD-10-CM

## 2024-04-22 MED ORDER — PREDNISONE 5 MG PO TABS: ORAL_TABLET | ORAL | 0 refills | Status: DC

## 2024-04-22 NOTE — Patient Instructions (Signed)

## 2024-04-22 NOTE — Progress Notes (Signed)
 Patient presents with complaint of pain in the posterior aspect of the heel for about 3 weeks now.  Some pain on the plantar aspect of the heel also.  Been bothering both heels.  Recall any injury to it.  Has not noticed any redness or ecchymosis.   Physical exam:  General appearance: Pleasant, and in no acute distress. AOx3.  Vascular: Pedal pulses: DP 2/4 bilaterally, PT 2/4 bilaterally. No edema lower legs bilaterally. Capillary fill time immediate bilaterally.  Neurological: Light touch intact feet bilaterally.  Normal Achilles reflex bilaterally.  No clonus or spasticity noted.  Tinel sign tarsal tunnel and porta pedis bilateral  Dermatologic:   Skin normal temperature bilaterally.  Skin normal color, tone, and texture bilaterally.   Musculoskeletal: Tenderness on posterior and plantar aspect of the heel bilaterally.  Tenderness at the insertion of the Achilles tendon bilaterally.  Tenderness at the plantar medial calcaneal tubercle bilaterally.  No tenderness with lateral compression of the calcaneus.  Achilles tendon is intact with no defects noted.  Radiographs: Radiographs 3 views feet bilaterally: There is some slight osteophytic changes at the insertion of the Achilles tendon and plantar calcaneal tubercle bilaterally.  No sign of stress fractures or erosive changes.  Kager's triangle intact  Diagnosis: Achilles tendinitis bilaterally 2.  Plantar fasciitis bilaterally  Plan: -New patient office visit for evaluation and management level 3. -Discussed plantar fasciitis etiology and treatment.  Discussed proper shoes. -Dispensed written exercises to do at home. -RICE -R Rx prednisone 5 mg, 30 mg p.o. daily first day, then decrease by 5 mg every other day for 12 days  Return 2 weeks follow-up

## 2024-05-05 ENCOUNTER — Ambulatory Visit: Admitting: Podiatry

## 2024-05-18 ENCOUNTER — Ambulatory Visit: Attending: Obstetrics and Gynecology | Admitting: Physical Therapy

## 2024-05-28 ENCOUNTER — Ambulatory Visit (INDEPENDENT_AMBULATORY_CARE_PROVIDER_SITE_OTHER): Admitting: Nurse Practitioner

## 2024-05-28 VITALS — BP 118/80 | HR 79 | Temp 97.9°F | Ht 64.0 in | Wt 199.0 lb

## 2024-05-28 DIAGNOSIS — R5383 Other fatigue: Secondary | ICD-10-CM

## 2024-05-28 DIAGNOSIS — Z6834 Body mass index (BMI) 34.0-34.9, adult: Secondary | ICD-10-CM

## 2024-05-28 DIAGNOSIS — K8020 Calculus of gallbladder without cholecystitis without obstruction: Secondary | ICD-10-CM

## 2024-05-28 DIAGNOSIS — E66811 Obesity, class 1: Secondary | ICD-10-CM

## 2024-05-28 LAB — COMPREHENSIVE METABOLIC PANEL WITH GFR
ALT: 13 U/L (ref 3–35)
AST: 14 U/L (ref 5–37)
Albumin: 4.5 g/dL (ref 3.5–5.2)
Alkaline Phosphatase: 108 U/L (ref 39–117)
BUN: 11 mg/dL (ref 6–23)
CO2: 28 meq/L (ref 19–32)
Calcium: 9.7 mg/dL (ref 8.4–10.5)
Chloride: 103 meq/L (ref 96–112)
Creatinine, Ser: 0.7 mg/dL (ref 0.40–1.20)
GFR: 118.32 mL/min (ref >60.00)
Glucose, Bld: 85 mg/dL (ref 70–99)
Potassium: 3.9 meq/L (ref 3.5–5.1)
Sodium: 140 meq/L (ref 135–145)
Total Bilirubin: 0.6 mg/dL (ref 0.2–1.2)
Total Protein: 7.5 g/dL (ref 6.0–8.3)

## 2024-05-28 LAB — LIPID PANEL
Cholesterol: 219 mg/dL — ABNORMAL HIGH (ref 28–200)
HDL: 47.1 mg/dL (ref >39.00)
LDL Cholesterol: 152 mg/dL — ABNORMAL HIGH (ref 10–99)
NonHDL: 171.83
Total CHOL/HDL Ratio: 5
Triglycerides: 97 mg/dL (ref 10.0–149.0)
VLDL: 19.4 mg/dL (ref 0.0–40.0)

## 2024-05-28 LAB — CBC WITH DIFFERENTIAL/PLATELET
Basophils Absolute: 0 K/uL (ref 0.0–0.1)
Basophils Relative: 0.6 % (ref 0.0–3.0)
Eosinophils Absolute: 0.2 K/uL (ref 0.0–0.7)
Eosinophils Relative: 3.5 % (ref 0.0–5.0)
HCT: 40.4 % (ref 36.0–46.0)
Hemoglobin: 13.5 g/dL (ref 12.0–15.0)
Lymphocytes Relative: 48.8 % — ABNORMAL HIGH (ref 12.0–46.0)
Lymphs Abs: 2.7 K/uL (ref 0.7–4.0)
MCHC: 33.4 g/dL (ref 30.0–36.0)
MCV: 86.6 fl (ref 78.0–100.0)
Monocytes Absolute: 0.4 K/uL (ref 0.1–1.0)
Monocytes Relative: 7.5 % (ref 3.0–12.0)
Neutro Abs: 2.2 K/uL (ref 1.4–7.7)
Neutrophils Relative %: 39.6 % — ABNORMAL LOW (ref 43.0–77.0)
Platelets: 322 K/uL (ref 150.0–400.0)
RBC: 4.66 Mil/uL (ref 3.87–5.11)
RDW: 13.3 % (ref 11.5–15.5)
WBC: 5.6 K/uL (ref 4.0–10.5)

## 2024-05-28 LAB — VITAMIN D 25 HYDROXY (VIT D DEFICIENCY, FRACTURES): VITD: 18 ng/mL — ABNORMAL LOW (ref 30.00–100.00)

## 2024-05-28 LAB — TSH: TSH: 1.06 u[IU]/mL (ref 0.35–5.50)

## 2024-05-28 LAB — VITAMIN B12: Vitamin B-12: 570 pg/mL (ref 211–911)

## 2024-05-28 NOTE — Assessment & Plan Note (Addendum)
 Class 1 obesity - BMI 34.16 Discussed low glycemic index diet and exercise. Considered weight loss medications post-breastfeeding if insurance allows. - Ordered CBC, vitamin B12, vitamin D , metabolic panel, cholesterol, thyroid  function tests. - Advised tracking caloric intake, low glycemic index diet. Shoot for daily caloric goal of 1800-2000/day. Monitor breast milk supply closely and increase caloric intake if supply dips. - Recommended 150 minutes of exercise per week. - Discussed potential for weight loss medications post-breastfeeding if insurance allows.

## 2024-05-28 NOTE — Assessment & Plan Note (Signed)
 Fatigue Likely multifactorial. Discussed potential contribution of low B12 and vitamin D  levels. - Ordered vitamin B12 and vitamin D  levels. - Advised on maintaining a healthy lifestyle and monitoring energy levels.

## 2024-05-28 NOTE — Assessment & Plan Note (Signed)
 Cholelithiasis Gallstones with one previous attack. Symptoms include bloating and cramps. Advised avoiding fatty foods. - Advised avoiding fatty foods. - Plan for gallbladder surgery once scheduled.

## 2024-05-28 NOTE — Progress Notes (Signed)
 New Patient Office Visit  Subjective    Patient ID: Alexandra Ayala, female    DOB: [birth year]  Age: 28 y.o. MRN: [id number]  CC:  Chief Complaint  Patient presents with   New Patient (Initial Visit)    Establish care, wants full panel of blood work, and also wan to talk about weight loss (weight medication)    HPI Alexandra Ayala presents to establish care Discussed the use of AI scribe software for clinical note transcription with the patient, who gave verbal consent to proceed.  History of Present Illness Alexandra Ayala is a 28 year old female who presents with concerns about weight loss post-pregnancy.  Postpartum weight retention - Delivered first child on December 26, 2023 - Currently breastfeeding and plans to continue for the time being - Desires to lose postpartum weight but has not started a structured regimen  Fatigue and low energy - Experiencing fatigue and low energy attributed to the postpartum period - Fatigue limits ability to change diet and initiate exercise regimen  Gastrointestinal symptoms and gallstones - Gallstones diagnosed postpartum - One prior gallstone attack - Ongoing intermittent stomach cramps and bloating - Awaiting scheduling of gallbladder surgery    Outpatient Encounter Medications as of 05/28/2024  Medication Sig   ibuprofen  (ADVIL ) 600 MG tablet Take 1 tablet (600 mg total) by mouth every 6 (six) hours as needed.   omeprazole (PRILOSEC) 20 MG capsule Take 20 mg by mouth daily.   omeprazole (PRILOSEC) 40 MG capsule Take 40 mg by mouth daily.   Prenatal Vit-Fe Fumarate-FA (MULTIVITAMIN-PRENATAL) 27-0.8 MG TABS tablet Take 1 tablet by mouth daily at 12 noon.   [DISCONTINUED] acetaminophen  (TYLENOL ) 325 MG tablet Take 2 tablets (650 mg total) by mouth every 6 (six) hours as needed (for pain scale < 4).   [DISCONTINUED] albuterol (PROVENTIL HFA;VENTOLIN HFA) 108 (90 Base) MCG/ACT inhaler Inhale 2 puffs into the lungs every 6  (six) hours as needed for wheezing or shortness of breath.   [DISCONTINUED] lidocaine  (XYLOCAINE ) 2 % jelly Apply 1 Application topically as needed.   [DISCONTINUED] predniSONE  (DELTASONE ) 5 MG tablet 12 day taper 6,6,5,5,4,4,3,3,2,2,1,1   No facility-administered encounter medications on file as of 05/28/2024.    Past Medical History:  Diagnosis Date   Asthma    has inhaler   Environmental and seasonal allergies    Gestational diabetes     Past Surgical History:  Procedure Laterality Date   NO PAST SURGERIES      No family history on file.  Social History   Socioeconomic History   Marital status: Single    Spouse name: Not on file   Number of children: Not on file   Years of education: Not on file   Highest education level: Not on file  Occupational History   Not on file  Tobacco Use   Smoking status: Never   Smokeless tobacco: Never  Vaping Use   Vaping status: Never Used  Substance and Sexual Activity   Alcohol use: Not Currently    Comment: occasional, last on 8-18   Drug use: Not Currently    Types: Marijuana    Comment: last used 01-28-18   Sexual activity: Yes    Birth control/protection: None  Other Topics Concern   Not on file  Social History Narrative   Not on file   Social Drivers of Health   Tobacco Use: Low Risk (05/18/2024)   Received from [facility]   Patient History    Smoking  Tobacco Use: Never    Smokeless Tobacco Use: Never    Passive Exposure: Not on file  Financial Resource Strain: Not on file  Food Insecurity: No Food Insecurity (12/27/2023)   Epic    Worried About Programme Researcher, Broadcasting/film/video in the Last Year: Never true    Ran Out of Food in the Last Year: Never true  Transportation Needs: No Transportation Needs (12/27/2023)   Epic    Lack of Transportation (Medical): No    Lack of Transportation (Non-Medical): No  Physical Activity: Not on file  Stress: Not on file  Social Connections: Not on file  Intimate Partner Violence:  Unknown (12/27/2023)   Epic    Fear of Current or Ex-Partner: No    Emotionally Abused: No    Physically Abused: Not on file    Sexually Abused: No  Depression (PHQ2-9): Low Risk (10/21/2023)   Depression (PHQ2-9)    PHQ-2 Score: 0  Alcohol Screen: Not on file  Housing: Low Risk (12/27/2023)   Epic    Unable to Pay for Housing in the Last Year: No    Number of Times Moved in the Last Year: 0    Homeless in the Last Year: No  Utilities: Not At Risk (12/27/2023)   Epic    Threatened with loss of utilities: No  Health Literacy: Not on file    Review of Systems  Constitutional:  Positive for malaise/fatigue.  Respiratory:  Negative for shortness of breath.   Cardiovascular:  Negative for chest pain.        Objective    BP 118/80   Pulse 79   Temp 97.9 F (36.6 C) (Temporal)   Ht 5' 4 (1.626 m)   Wt 199 lb (90.3 kg)   SpO2 99%   Breastfeeding Yes   BMI 34.16 kg/m   Physical Exam Vitals reviewed.  Constitutional:      General: She is not in acute distress.    Appearance: Normal appearance.  HENT:     Head: Normocephalic and atraumatic.  Cardiovascular:     Rate and Rhythm: Normal rate and regular rhythm.     Pulses: Normal pulses.     Heart sounds: Normal heart sounds.  Pulmonary:     Effort: Pulmonary effort is normal.     Breath sounds: Normal breath sounds.  Skin:    General: Skin is warm and dry.  Neurological:     General: No focal deficit present.     Mental Status: She is alert and oriented to person, place, and time.  Psychiatric:        Mood and Affect: Mood normal.        Behavior: Behavior normal.        Judgment: Judgment normal.         Assessment & Plan:   Problem List Items Addressed This Visit   None Visit Diagnoses       Class 1 obesity without serious comorbidity with body mass index (BMI) of 34.0 to 34.9 in adult, unspecified obesity type    -  Primary   Relevant Orders   Comprehensive metabolic panel with GFR   Lipid panel    TSH   VITAMIN D  25 Hydroxy (Vit-D Deficiency, Fractures)   Vitamin B12   CBC with Differential/Platelet     Fatigue, unspecified type       Relevant Orders   Comprehensive metabolic panel with GFR   Lipid panel   TSH   VITAMIN D  25 Hydroxy (Vit-D  Deficiency, Fractures)   Vitamin B12   CBC with Differential/Platelet      Assessment and Plan Assessment & Plan Class 1 obesity - BMI 34.16 Discussed low glycemic index diet and exercise. Considered weight loss medications post-breastfeeding if insurance allows. - Ordered CBC, vitamin B12, vitamin D , metabolic panel, cholesterol, thyroid  function tests. - Advised tracking caloric intake, low glycemic index diet. Shoot for daily caloric goal of 1800-2000/day. Monitor breast milk supply closely and increase caloric intake if supply dips. - Recommended 150 minutes of exercise per week. - Discussed potential for weight loss medications post-breastfeeding if insurance allows.  Fatigue Likely multifactorial. Discussed potential contribution of low B12 and vitamin D  levels. - Ordered vitamin B12 and vitamin D  levels. - Advised on maintaining a healthy lifestyle and monitoring energy levels.  Cholelithiasis Gallstones with one previous attack. Symptoms include bloating and cramps. Advised avoiding fatty foods. - Advised avoiding fatty foods. - Plan for gallbladder surgery once scheduled.     Return in about 6 months (around 11/26/2024) for CPE with Nova Schmuhl.   Alexandra FORBES Pereyra, NP

## 2024-05-28 NOTE — Patient Instructions (Addendum)
 Low-Glycemic Index 1800-2000 calories/day 150 minutes of exercise per week If your breastmilk supply drops then increase daily calorie intake

## 2024-05-29 ENCOUNTER — Ambulatory Visit: Payer: Self-pay | Admitting: Nurse Practitioner

## 2024-05-29 ENCOUNTER — Other Ambulatory Visit: Payer: Self-pay | Admitting: Nurse Practitioner

## 2024-05-29 DIAGNOSIS — E559 Vitamin D deficiency, unspecified: Secondary | ICD-10-CM

## 2024-05-29 MED ORDER — VITAMIN D (ERGOCALCIFEROL) 1.25 MG (50000 UNIT) PO CAPS: 50000.0000 [IU] | ORAL_CAPSULE | ORAL | 0 refills | Status: AC

## 2024-06-21 ENCOUNTER — Encounter (HOSPITAL_COMMUNITY): Payer: Self-pay

## 2024-06-21 ENCOUNTER — Emergency Department (HOSPITAL_COMMUNITY)

## 2024-06-21 ENCOUNTER — Emergency Department (HOSPITAL_COMMUNITY)
Admission: EM | Admit: 2024-06-21 | Discharge: 2024-06-21 | Disposition: A | Discharge status: Home / Self Care | Attending: Emergency Medicine | Admitting: Emergency Medicine

## 2024-06-21 ENCOUNTER — Other Ambulatory Visit: Payer: Self-pay

## 2024-06-21 DIAGNOSIS — R1011 Right upper quadrant pain: Secondary | ICD-10-CM | POA: Insufficient documentation

## 2024-06-21 DIAGNOSIS — R1013 Epigastric pain: Secondary | ICD-10-CM | POA: Diagnosis present

## 2024-06-21 DIAGNOSIS — R079 Chest pain, unspecified: Secondary | ICD-10-CM | POA: Diagnosis not present

## 2024-06-21 DIAGNOSIS — R0602 Shortness of breath: Secondary | ICD-10-CM | POA: Diagnosis not present

## 2024-06-21 DIAGNOSIS — J45909 Unspecified asthma, uncomplicated: Secondary | ICD-10-CM | POA: Insufficient documentation

## 2024-06-21 LAB — CBC WITH DIFFERENTIAL/PLATELET
Abs Immature Granulocytes: 0.01 K/uL (ref 0.00–0.07)
Basophils Absolute: 0 K/uL (ref 0.0–0.1)
Basophils Relative: 0 %
Eosinophils Absolute: 0.1 K/uL (ref 0.0–0.5)
Eosinophils Relative: 2 %
HCT: 42.9 % (ref 36.0–46.0)
Hemoglobin: 14.3 g/dL (ref 12.0–15.0)
Immature Granulocytes: 0 %
Lymphocytes Relative: 28 %
Lymphs Abs: 1.9 K/uL (ref 0.7–4.0)
MCH: 29.5 pg (ref 26.0–34.0)
MCHC: 33.3 g/dL (ref 30.0–36.0)
MCV: 88.5 fL (ref 80.0–100.0)
Monocytes Absolute: 0.4 K/uL (ref 0.1–1.0)
Monocytes Relative: 6 %
Neutro Abs: 4.4 K/uL (ref 1.7–7.7)
Neutrophils Relative %: 64 %
Platelets: 346 K/uL (ref 150–400)
RBC: 4.85 MIL/uL (ref 3.87–5.11)
RDW: 12.3 % (ref 11.5–15.5)
WBC: 6.9 K/uL (ref 4.0–10.5)
nRBC: 0 % (ref 0.0–0.2)

## 2024-06-21 LAB — COMPREHENSIVE METABOLIC PANEL WITH GFR
ALT: 134 U/L — ABNORMAL HIGH (ref 0–44)
AST: 140 U/L — ABNORMAL HIGH (ref 15–41)
Albumin: 4.5 g/dL (ref 3.5–5.0)
Alkaline Phosphatase: 145 U/L — ABNORMAL HIGH (ref 38–126)
Anion gap: 12 (ref 5–15)
BUN: 17 mg/dL (ref 6–20)
CO2: 24 mmol/L (ref 22–32)
Calcium: 9.8 mg/dL (ref 8.9–10.3)
Chloride: 103 mmol/L (ref 98–111)
Creatinine, Ser: 0.85 mg/dL (ref 0.44–1.00)
GFR, Estimated: >60 mL/min
Glucose, Bld: 101 mg/dL — ABNORMAL HIGH (ref 70–99)
Potassium: 4.2 mmol/L (ref 3.5–5.1)
Sodium: 139 mmol/L (ref 135–145)
Total Bilirubin: 1.2 mg/dL (ref 0.0–1.2)
Total Protein: 7.7 g/dL (ref 6.5–8.1)

## 2024-06-21 LAB — D-DIMER, QUANTITATIVE: D-Dimer, Quant: 0.88 ug{FEU}/mL — ABNORMAL HIGH (ref 0.00–0.50)

## 2024-06-21 LAB — LIPASE, BLOOD: Lipase: 29 U/L (ref 11–51)

## 2024-06-21 LAB — TROPONIN T, HIGH SENSITIVITY
Troponin T High Sensitivity: <15 ng/L (ref 0–19)
Troponin T High Sensitivity: <15 ng/L (ref 0–19)

## 2024-06-21 LAB — HCG, SERUM, QUALITATIVE: Preg, Serum: NEGATIVE

## 2024-06-21 MED ORDER — OXYCODONE-ACETAMINOPHEN 5-325 MG PO TABS: 1.0000 | ORAL_TABLET | Freq: Four times a day (QID) | ORAL | 0 refills | Status: AC | PRN

## 2024-06-21 MED ORDER — METOCLOPRAMIDE HCL 5 MG/ML IJ SOLN
10.0000 mg | Freq: Once | INTRAMUSCULAR | Status: AC
  Administered 2024-06-21: 10 mg via INTRAVENOUS
  Filled 2024-06-21: qty 2

## 2024-06-21 MED ORDER — DIPHENHYDRAMINE HCL 50 MG/ML IJ SOLN
12.5000 mg | Freq: Once | INTRAMUSCULAR | Status: AC
  Administered 2024-06-21: 12.5 mg via INTRAVENOUS
  Filled 2024-06-21: qty 1

## 2024-06-21 MED ORDER — GADOBUTROL 1 MMOL/ML IV SOLN
9.0000 mL | Freq: Once | INTRAVENOUS | Status: AC | PRN
  Administered 2024-06-21: 9 mL via INTRAVENOUS

## 2024-06-21 MED ORDER — ONDANSETRON HCL 4 MG/2ML IJ SOLN
4.0000 mg | Freq: Once | INTRAMUSCULAR | Status: AC
  Administered 2024-06-21: 4 mg via INTRAVENOUS
  Filled 2024-06-21: qty 2

## 2024-06-21 MED ORDER — LACTATED RINGERS IV BOLUS
1000.0000 mL | Freq: Once | INTRAVENOUS | Status: AC
  Administered 2024-06-21: 1000 mL via INTRAVENOUS

## 2024-06-21 MED ORDER — IOHEXOL 350 MG/ML SOLN
75.0000 mL | Freq: Once | INTRAVENOUS | Status: AC | PRN
  Administered 2024-06-21: 75 mL via INTRAVENOUS

## 2024-06-21 MED ORDER — OXYCODONE-ACETAMINOPHEN 5-325 MG PO TABS: 1.0000 | ORAL_TABLET | Freq: Four times a day (QID) | ORAL | 0 refills | Status: DC | PRN

## 2024-06-21 MED ORDER — HYDROMORPHONE HCL 1 MG/ML IJ SOLN
0.5000 mg | Freq: Once | INTRAMUSCULAR | Status: AC
  Administered 2024-06-21: 0.5 mg via INTRAVENOUS
  Filled 2024-06-21: qty 1

## 2024-06-21 MED ORDER — MORPHINE SULFATE (PF) 4 MG/ML IV SOLN
4.0000 mg | Freq: Once | INTRAVENOUS | Status: AC
  Administered 2024-06-21: 4 mg via INTRAVENOUS
  Filled 2024-06-21: qty 1

## 2024-06-21 NOTE — ED Triage Notes (Signed)
 Pt got gallbladder taken out 3 days ago, now experiencing epigastric/ back pain. Pt is currently breastfeeding. Denies vomiting. Axox4.

## 2024-06-21 NOTE — ED Provider Notes (Signed)
 " [facility] Provider Note   CSN: [id number] Arrival date & time: 06/21/24  1038     Patient presents with: No chief complaint on file.   Alexandra Ayala is a 29 y.o. female with PMHx asthma who presents to ED concerned for worsening epigastric/lower chest pain over the past couple of days.  Patient had cholecystectomy 4 days ago but states that pain was never this severe.  Patient endorsing some shortness of breath when the pain gets severe as it hurts to take a deep breath in.  Denies fever, cough, vomiting, diarrhea.  Denies leg swelling or calf pain.   HPI     Prior to Admission medications  Medication Sig Start Date End Date Taking? Authorizing Provider  ibuprofen  (ADVIL ) 600 MG tablet Take 1 tablet (600 mg total) by mouth every 6 (six) hours as needed. 12/28/23   Gavlinski, Lucey A, MD  omeprazole (PRILOSEC) 20 MG capsule Take 20 mg by mouth daily. 12/19/23   [provider]  omeprazole (PRILOSEC) 40 MG capsule Take 40 mg by mouth daily.    [provider]  Prenatal Vit-Fe Fumarate-FA (MULTIVITAMIN-PRENATAL) 27-0.8 MG TABS tablet Take 1 tablet by mouth daily at 12 noon.    [provider]  Vitamin D , Ergocalciferol , (DRISDOL ) 1.25 MG (50000 UNIT) CAPS capsule Take 1 capsule (50,000 Units total) by mouth every 7 (seven) days. 05/29/24   Elnor Lauraine BRAVO, NP    Allergies: Patient has no known allergies.    Review of Systems  Cardiovascular:  Positive for chest pain.  Gastrointestinal:  Positive for abdominal pain.    Updated Vital Signs BP (!) 154/125 (BP Location: Left Arm)   Pulse 86   Temp 98.1 F (36.7 C)   Resp 14   Ht 5' 4 (1.626 m)   Wt 88.9 kg   LMP  (LMP Unknown)   SpO2 96%   Breastfeeding Yes   BMI 33.64 kg/m   Physical Exam Vitals and nursing note reviewed.  Constitutional:      General: She is not in acute distress.    Appearance: She is not ill-appearing or toxic-appearing.   HENT:     Head: Normocephalic and atraumatic.     Mouth/Throat:     Mouth: Mucous membranes are moist.  Eyes:     General: No scleral icterus.       Right eye: No discharge.        Left eye: No discharge.     Conjunctiva/sclera: Conjunctivae normal.  Cardiovascular:     Rate and Rhythm: Normal rate and regular rhythm.     Pulses: Normal pulses.     Heart sounds: Normal heart sounds. No murmur heard. Pulmonary:     Effort: Pulmonary effort is normal. No respiratory distress.     Breath sounds: Normal breath sounds. No wheezing, rhonchi or rales.  Abdominal:     General: Abdomen is flat. Bowel sounds are normal. There is no distension.     Palpations: Abdomen is soft. There is no mass.     Tenderness: There is abdominal tenderness.     Comments: Gastric tenderness to palpation.  Surgical sites are clean, dry, intact.  No erythema or purulence appreciated.  Musculoskeletal:     Right lower leg: No edema.     Left lower leg: No edema.  Skin:    General: Skin is warm and dry.     Findings: No rash.  Neurological:     General:  No focal deficit present.     Mental Status: She is alert and oriented to person, place, and time. Mental status is at baseline.  Psychiatric:        Mood and Affect: Mood normal.        Behavior: Behavior normal.     (all labs ordered are listed, but only abnormal results are displayed) Labs Reviewed  COMPREHENSIVE METABOLIC PANEL WITH GFR - Abnormal; Notable for the following components:      Result Value   Glucose, Bld 101 (*)    AST 140 (*)    ALT 134 (*)    Alkaline Phosphatase 145 (*)    All other components within normal limits  D-DIMER, QUANTITATIVE - Abnormal; Notable for the following components:   D-Dimer, Quant 0.88 (*)    All other components within normal limits  LIPASE, BLOOD  CBC WITH DIFFERENTIAL/PLATELET  HCG, SERUM, QUALITATIVE  TROPONIN T, HIGH SENSITIVITY  TROPONIN T, HIGH SENSITIVITY    EKG: None  Radiology: DG Chest  2 View Result Date: 06/21/2024 CLINICAL DATA:  Shortness of breath. Cholecystectomy 3 days ago. Upper abdominal pain. EXAM: CHEST - 2 VIEW COMPARISON:  None Available. FINDINGS: Trachea is midline. Heart size normal. Lungs are clear. No pleural fluid. IMPRESSION: Negative. Electronically Signed   By: Newell Eke M.D.   On: 06/21/2024 12:50     Procedures   Medications Ordered in the ED - No data to display  Clinical Course as of 06/21/24 1508  Sun Jun 21, 2024  1505 -abdominal pain with worsening pain post-op -f.u imaging --> surgery [SE]    Clinical Course User Index [SE] Guillermina Hamilton, MD                                 Medical Decision Making Amount and/or Complexity of Data Reviewed Radiology: ordered.  Risk Prescription drug management.    This patient presents to the ED for concern of abdominal pain, this involves an extensive number of treatment options, and is a complaint that carries with it a high risk of complications and morbidity.  The differential diagnosis includes gastroenteritis, colitis, small bowel obstruction, appendicitis, cholecystitis, pancreatitis, nephrolithiasis, UTI, pyleonephritis  Co morbidities that complicate the patient evaluation  Asthma, recent cholecystectomy   Additional history obtained:  Additional history obtained from 1/7 cholecystectomy   Problem List / ED Course / Critical interventions / Medication management  Patient presented for gastric/lower chest pain progressively getting worse over the past couple days.  Pain medicine at home is inadequate.  Physical exam with severe epigastric tenderness to palpation.  Rest of physical exam reassuring.  Patient afebrile with stable vitals. I Ordered, and personally interpreted labs.  CBC without leukocytosis or anemia.  hCG negative.  D-dimer elevated at 0.8.  CMP with elevated liver enzymes.  Lipase within normal limits.  Initial troponin within normal limits. I ordered imaging  studies including CT Abd/Pelvis, CTA chest and chest x-ray.  Chest x-ray without acute cardiopulmonary disease.  Rest of imaging workup pending. I personally viewed and interpreted the EKG/cardiac monitored which showed an underlying rhythm of: sinus rhythm Provided patient with morphine  and zofran . I have reviewed the patients home medicines and have made adjustments as needed   Social Determinants of Health:  none  3PM Care of Waddell Sprang Loughmiller  transferred to Dr. Guillermina at the end of my shift as the patient will require reassessment once labs/imaging have resulted.  Patient presentation, ED course, and plan of care discussed with review of all pertinent labs and imaging. Please see his/her note for further details regarding further ED course and disposition. Plan at time of handoff is assess patient after imaging. This may be altered or completely changed at the discretion of the oncoming team pending results of further workup.      Final diagnoses:  None    ED Discharge Orders     None          Hoy Nidia FALCON, [place] 06/21/24 1509  "

## 2024-06-21 NOTE — ED Notes (Signed)
"  To MRI  "

## 2024-06-21 NOTE — ED Provider Notes (Signed)
 Assume Care - Medical Decision Making  Care of patient assumed from previous emergency medicine provider at 1500. See their note for further details of history, physical exam and plan.  Briefly, Alexandra Ayala is a 29 y.o. female who presents with: Postoperative pain after cholecystectomy.  Clinical Course as of 06/21/24 2247  [place] Jun 21, 2024  1505 -abdominal pain with worsening pain post-op -f.u imaging --> surgery [SE]    Clinical Course User Index [SE] Guillermina Hamilton, MD     Reassessment: I personally reassessed the patient:  Vital Signs:  The most current vitals were  Vitals:   06/21/24 1120 06/21/24 1450 06/21/24 1750 06/21/24 2235  BP:  139/87 127/61 129/70  Pulse:  85 78 73  Resp:  16 18 16   Temp:  98 F (36.7 C) 98.8 F (37.1 C)   TempSrc:   Oral   SpO2:  100% 100% 100%  Weight: 88.9 kg     Height: 5' 4 (1.626 m)      Hemodynamics:  The patient is hemodynamically stable. Mental Status:  The patient is alert  Additional MDM/ED Course: Workup thus far largely unremarkable.  No significant leukocytosis, anemia, or thrombocytopenia on CBC.  Pregnancy negative.  D-dimer was elevated that was obtained in first look and CT PE showed no evidence of pulmonary embolism.  Troponin was also negative.  Does have acutely elevated LFTs.  No obvious etiology of this on CT abdomen pelvis.  Could be just postoperative, or obstructive like in the setting of choledocholithiasis.  Will obtain MRCP.  MRCP unremarkable.  Spoke in depth with patient about pain control at home.  Has not been taking any pain medication because she is wanting to breast-feed currently.  She states that the pain has gotten pretty bad though.  Had a long discussion with the patient and gave her prescription for Percocet to go home with.  She is understanding that she can continue to pump while taking the Percocet, but is not to give any of that milk to her baby.  This will keep her milk supply up.  But she is  going to resume breast-feeding once she has not taken any oxycodone  for 48 hours.  She plans on the use this is a rescue medication.  Baby will get formula during the meantime.  Patient discharged home in hemodynamically stable condition with strict turn precautions.  She is understanding of what she needs to do to avoid passing along drugs to her child and plans to follow-up with her PCP.  Disposition:  I discussed the plan for discharge with the patient and/or their surrogate at bedside prior to discharge and they were in agreement with the plan and verbalized understanding of the return precautions provided. All questions answered to the best of my ability. Ultimately, the patient was discharged in stable condition with stable vital signs. I am reassured that they are capable of close follow up and good social support at home.   Clinical Impression:  1. RUQ pain     Rx / DC Orders ED Discharge Orders          Ordered    oxyCODONE -acetaminophen  (PERCOCET/ROXICET) 5-325 MG tablet  Every 6 hours PRN        06/21/24 2244            The plan for this patient was discussed with Dr. Jakie, who voiced agreement and who oversaw evaluation and treatment of this patient.   Electronically signed by:   Hamilton Dunnings  Guillermina, M.D. PGY-2, Emergency Medicine      Guillermina Hamilton, MD 06/21/24 2247    Yolande Lamar BROCKS, MD 06/22/24 1320

## 2024-06-21 NOTE — Discharge Instructions (Addendum)
 You were seen today for abdominal pain. While you were here we monitored your vitals, preformed a physical exam, and imaging. These were all reassuring and there is no indication for any further testing or intervention in the emergency department at this time.   Things to do:  - Follow up with your primary care provider within the next 1-2 weeks - Please take your Percocet as needed for pain if the muscle relaxer is not working - Do not breast-feed for 48 hours after taking oxycodone .  Use formula to feed your baby while taking oxycodone .  Once you have not taken oxycodone  for 48 hours, you can resume breast-feeding.  Continue to pump while taking oxycodone , but do not give it to your baby.  This will keep your milk supply up.  Return to the emergency department if you have any new or worsening symptoms including worsening abdominal pain, inability tolerate p.o. course of nausea or vomiting, or if you have any other concerns.

## 2024-06-21 NOTE — ED Notes (Signed)
 Pt d/c home with mother per EDP order. Discharge summary reviewed, verbalize understanding. NAD, reports mother is d/c ride home

## 2024-06-21 NOTE — ED Provider Triage Note (Signed)
 Emergency Medicine Provider Triage Evaluation Note  Alexandra Ayala , a 29 y.o. female  was evaluated in triage.  Pt complains of 3 s/p chole pain.  Review of Systems  Positive: N, post op pain, epigastric pain, SHOB Negative: Fever, chills, vomiting  Physical Exam  BP (!) 154/125 (BP Location: Left Arm)   Pulse 86   Temp 98.1 F (36.7 C)   Resp 14   Ht 5' 4 (1.626 m)   Wt 88.9 kg   LMP  (LMP Unknown)   SpO2 96%   Breastfeeding Yes   BMI 33.64 kg/m  Gen:   Awake, uncomfy appearing Resp:  Normal effort  MSK:   Moves extremities without difficulty  Other:    Medical Decision Making  Medically screening exam initiated at 11:30 AM.  Appropriate orders placed.  Alexandra Ayala was informed that the remainder of the evaluation will be completed by another provider, this initial triage assessment does not replace that evaluation, and the importance of remaining in the ED until their evaluation is complete.  Labs and imaging ordered   Alexandra Ayala LOISE DEVONNA 06/21/24 1132

## 2024-08-28 ENCOUNTER — Ambulatory Visit: Admitting: Nurse Practitioner
